# Patient Record
Sex: Male | Born: 1965 | Race: White | Hispanic: No | Marital: Married | Smoking: Former smoker
Health system: Southern US, Community
[De-identification: ages and names within clinical notes are randomized; demographics above are authoritative.]

## PROBLEM LIST (undated history)

## (undated) DIAGNOSIS — R7303 Prediabetes: Secondary | ICD-10-CM

## (undated) DIAGNOSIS — F1010 Alcohol abuse, uncomplicated: Secondary | ICD-10-CM

## (undated) DIAGNOSIS — Z955 Presence of coronary angioplasty implant and graft: Secondary | ICD-10-CM

## (undated) DIAGNOSIS — I2510 Atherosclerotic heart disease of native coronary artery without angina pectoris: Secondary | ICD-10-CM

## (undated) DIAGNOSIS — Z720 Tobacco use: Secondary | ICD-10-CM

## (undated) DIAGNOSIS — I10 Essential (primary) hypertension: Secondary | ICD-10-CM

## (undated) DIAGNOSIS — I219 Acute myocardial infarction, unspecified: Secondary | ICD-10-CM

## (undated) DIAGNOSIS — M1990 Unspecified osteoarthritis, unspecified site: Secondary | ICD-10-CM

## (undated) HISTORY — PX: ARTHROSCOPIC REPAIR ACL: SUR80

## (undated) HISTORY — DX: Alcohol abuse, uncomplicated: F10.10

## (undated) HISTORY — PX: KNEE SURGERY: SHX244

## (undated) HISTORY — PX: ANTERIOR CRUCIATE LIGAMENT REPAIR: SHX115

---

## 1996-06-04 HISTORY — PX: APPENDECTOMY: SHX54

## 1999-07-08 ENCOUNTER — Emergency Department (HOSPITAL_COMMUNITY): Admission: EM | Admit: 1999-07-08 | Discharge: 1999-07-08 | Payer: Self-pay | Admitting: Emergency Medicine

## 2011-06-29 ENCOUNTER — Ambulatory Visit (INDEPENDENT_AMBULATORY_CARE_PROVIDER_SITE_OTHER): Payer: BC Managed Care – PPO | Admitting: Internal Medicine

## 2011-06-29 ENCOUNTER — Encounter: Payer: Self-pay | Admitting: Internal Medicine

## 2011-06-29 DIAGNOSIS — R05 Cough: Secondary | ICD-10-CM

## 2011-06-29 DIAGNOSIS — F17200 Nicotine dependence, unspecified, uncomplicated: Secondary | ICD-10-CM

## 2011-06-29 DIAGNOSIS — R059 Cough, unspecified: Secondary | ICD-10-CM

## 2011-06-29 MED ORDER — LEVOFLOXACIN 500 MG PO TABS: 500.0000 mg | ORAL_TABLET | Freq: Every day | ORAL | Status: DC

## 2011-06-30 ENCOUNTER — Encounter: Payer: Self-pay | Admitting: Internal Medicine

## 2011-06-30 DIAGNOSIS — R05 Cough: Secondary | ICD-10-CM | POA: Insufficient documentation

## 2011-06-30 DIAGNOSIS — F17200 Nicotine dependence, unspecified, uncomplicated: Secondary | ICD-10-CM | POA: Insufficient documentation

## 2011-06-30 DIAGNOSIS — R059 Cough, unspecified: Secondary | ICD-10-CM | POA: Insufficient documentation

## 2011-06-30 NOTE — Assessment & Plan Note (Signed)
Discussed and recommended cessation.

## 2011-06-30 NOTE — Assessment & Plan Note (Signed)
Suspect infectious etiology. Begin levaquin x 7days. Pt defers cxr currently. Followup if no improvement or worsening.

## 2011-06-30 NOTE — Progress Notes (Signed)
  Subjective:    Patient ID: William Bates, male    DOB: Mar 04, 1966, 46 y.o.   MRN: [id number]  HPI Pt presents to clinic for evaluation of cough. Notes one month h/o cough productive for yellow sputum without hemoptysis, fever or chills. Notes some chest discomfort with cough but no dyspnea or wheezing. Taking otc medication without improvement. No alleviating or exacerbating factors. No other complaints.   Past Medical History  Diagnosis Date  . Alcohol abuse    Past Surgical History  Procedure Date  . Appendectomy 1998  . Anterior cruciate ligament repair [phone]    right    reports that he has been smoking.  He does not have any smokeless tobacco history on file. He reports that he does not drink alcohol or use illicit drugs. family history includes Arthritis in his father; Diabetes in his father; Hypertension in his mother; and Prostate cancer in his paternal uncle.  There is no history of Breast cancer, and Heart disease, and Colon cancer, . No Known Allergies   Review of Systems  HENT: Positive for congestion.   Respiratory: Positive for cough. Negative for shortness of breath and wheezing.   All other systems reviewed and are negative.       Objective:   Physical Exam  Nursing note and vitals reviewed. Constitutional: He appears well-developed and well-nourished. No distress.  HENT:  Head: Normocephalic and atraumatic.  Right Ear: External ear normal.  Left Ear: External ear normal.  Nose: Nose normal.  Mouth/Throat: Oropharynx is clear and moist. No oropharyngeal exudate.  Eyes: Conjunctivae are normal. Right eye exhibits no discharge. Left eye exhibits no discharge. No scleral icterus.  Neck: Neck supple.  Cardiovascular: Normal rate, regular rhythm and normal heart sounds.  Exam reveals no gallop and no friction rub.   No murmur heard. Pulmonary/Chest: Effort normal and breath sounds normal. No respiratory distress. He has no wheezes. He has no rales.    Lymphadenopathy:    He has no cervical adenopathy.  Neurological: He is alert.  Skin: Skin is warm and dry. He is not diaphoretic.  Psychiatric: He has a normal mood and affect.          Assessment & Plan:

## 2012-03-10 ENCOUNTER — Ambulatory Visit (INDEPENDENT_AMBULATORY_CARE_PROVIDER_SITE_OTHER): Payer: BC Managed Care – PPO | Admitting: Internal Medicine

## 2012-03-10 ENCOUNTER — Encounter: Payer: Self-pay | Admitting: Internal Medicine

## 2012-03-10 VITALS — BP 118/76 | HR 68 | Temp 98.1°F | Wt 230.0 lb

## 2012-03-10 DIAGNOSIS — J069 Acute upper respiratory infection, unspecified: Secondary | ICD-10-CM

## 2012-03-10 MED ORDER — LEVOFLOXACIN 500 MG PO TABS: 500.0000 mg | ORAL_TABLET | Freq: Every day | ORAL | Status: AC

## 2012-03-10 NOTE — Progress Notes (Signed)
  Subjective:    Patient ID: William Bates, male    DOB: [birth year], 46 y.o.   MRN: [id number]  HPI patient presents to clinic for evaluation of cough and congestion. Notes six-day history of sore throat, cough intermittently productive for yellow-green sputum without hemoptysis as well as nasal congestion. Denies fever chills, shortness of breath or wheezing. Taking over-the-counter cold medication without improvement. No other alleviating or exacerbating factors.  Past Medical History  Diagnosis Date  . Alcohol abuse    Past Surgical History  Procedure Date  . Appendectomy 1998  . Anterior cruciate ligament repair [phone]    right    reports that he has been smoking.  He does not have any smokeless tobacco history on file. He reports that he does not drink alcohol or use illicit drugs. family history includes Arthritis in his father; Diabetes in his father; Hypertension in his mother; and Prostate cancer in his paternal uncle.  There is no history of Breast cancer, and Heart disease, and Colon cancer, . No Known Allergies  Review of Systems see history of present illness     Objective:   Physical Exam  Nursing note and vitals reviewed. Constitutional: He appears well-developed and well-nourished. No distress.  HENT:  Head: Normocephalic and atraumatic.  Right Ear: Tympanic membrane, external ear and ear canal normal.  Left Ear: Tympanic membrane, external ear and ear canal normal.  Nose: Nose normal.  Mouth/Throat: Uvula is midline, oropharynx is clear and moist and mucous membranes are normal. No oropharyngeal exudate, posterior oropharyngeal edema, posterior oropharyngeal erythema or tonsillar abscesses.  Pulmonary/Chest: Effort normal and breath sounds normal. No respiratory distress. He has no wheezes. He has no rales.  Neurological: He is alert.  Skin: He is not diaphoretic.  Psychiatric: He has a normal mood and affect.          Assessment & Plan:

## 2012-03-10 NOTE — Assessment & Plan Note (Signed)
Continue symptomatic treatment. Given printed anabolic prescription to hold. Begin antibiotic if symptoms do not improve after total duration of approximately 8-10 days. Followup if no improvement or worsening.

## 2012-11-17 ENCOUNTER — Ambulatory Visit (INDEPENDENT_AMBULATORY_CARE_PROVIDER_SITE_OTHER): Payer: BC Managed Care – PPO | Admitting: Family

## 2012-11-17 ENCOUNTER — Encounter: Payer: Self-pay | Admitting: Family

## 2012-11-17 VITALS — BP 122/80 | HR 70 | Temp 98.3°F | Wt 240.0 lb

## 2012-11-17 DIAGNOSIS — J329 Chronic sinusitis, unspecified: Secondary | ICD-10-CM

## 2012-11-17 DIAGNOSIS — F17200 Nicotine dependence, unspecified, uncomplicated: Secondary | ICD-10-CM

## 2012-11-17 MED ORDER — AMOXICILLIN-POT CLAVULANATE 875-125 MG PO TABS: 1.0000 | ORAL_TABLET | Freq: Two times a day (BID) | ORAL | Status: DC

## 2012-11-17 NOTE — Assessment & Plan Note (Signed)
Recommended nasal saline rinses, augmentin, call if symptoms worse or if not improved in 2-3 days.

## 2012-11-17 NOTE — Patient Instructions (Addendum)

## 2012-11-17 NOTE — Progress Notes (Signed)
  Subjective:    Patient ID: William Bates, male    DOB: 11-Feb-1966, 47 y.o.   MRN: [id number]  HPI  William Bates is a 47 yr old male who presents today with chief complaint of sinus congestion.  Reports + frontal sinus congestion. Congestion has been present x 8 days. Started with sore throat and congestion. Reports mild cough.  Nasal drainage is green and is more copious from the left nare.   Yesterday woke up and felt "like I got hit by a truck." Denies known fever.  Reports associated diarrhea and bloating. Denies abdominal pain.    Tobacco abuse- < 1/2 PPD.    Review of Systems See HPI  Past Medical History  Diagnosis Date  . Alcohol abuse     History   Social History  . Marital Status: Single    Spouse Name: N/A    Number of Children: N/A  . Years of Education: N/A   Occupational History  . Not on file.   Social History Main Topics  . Smoking status: Current Every Day Smoker  . Smokeless tobacco: Not on file     Comment: 1/2 ppd  . Alcohol Use: No  . Drug Use: No  . Sexually Active: Not on file   Other Topics Concern  . Not on file   Social History Narrative  . No narrative on file    Past Surgical History  Procedure Laterality Date  . Appendectomy  1998  . Anterior cruciate ligament repair  [phone]    right    Family History  Problem Relation Age of Onset  . Arthritis Father   . Hypertension Mother   . Diabetes Father   . Prostate cancer Paternal Uncle   . Breast cancer Neg Hx   . Heart disease Neg Hx   . Colon cancer Neg Hx     No Known Allergies  No current outpatient prescriptions on file prior to visit.   No current facility-administered medications on file prior to visit.    BP 122/80  Pulse 70  Temp(Src) 98.3 F (36.8 C) (Oral)  Wt 240 lb (108.863 kg)  BMI 33.23 kg/m2  SpO2 98%       Objective:   Physical Exam  Constitutional: He is oriented to person, place, and time. He appears well-developed and well-nourished. No  distress.  HENT:  Head: Normocephalic and atraumatic.  Right Ear: Tympanic membrane and ear canal normal.  Left Ear: Tympanic membrane and ear canal normal.  No significant frontal or maxillary sinus tenderness to palpation.  Mild oropharyngeal erythema without exudates.  Eyes: No scleral icterus.  Cardiovascular: Normal rate and regular rhythm.   No murmur heard. Pulmonary/Chest: Effort normal and breath sounds normal. No respiratory distress. He has no wheezes. He has no rales. He exhibits no tenderness.  Lymphadenopathy:    He has no cervical adenopathy.  Neurological: He is alert and oriented to person, place, and time.  Psychiatric: He has a normal mood and affect. His behavior is normal. Judgment and thought content normal.          Assessment & Plan:

## 2012-11-17 NOTE — Assessment & Plan Note (Signed)
Pt counseled on smoking cessation.  

## 2015-10-18 ENCOUNTER — Ambulatory Visit (INDEPENDENT_AMBULATORY_CARE_PROVIDER_SITE_OTHER): Payer: Managed Care, Other (non HMO)

## 2015-10-18 ENCOUNTER — Ambulatory Visit
Admission: EM | Admit: 2015-10-18 | Discharge: 2015-10-18 | Disposition: A | Discharge status: Home / Self Care | Payer: Managed Care, Other (non HMO) | Attending: Family Medicine | Admitting: Family Medicine

## 2015-10-18 ENCOUNTER — Encounter: Payer: Self-pay | Admitting: *Deleted

## 2015-10-18 DIAGNOSIS — S39012A Strain of muscle, fascia and tendon of lower back, initial encounter: Secondary | ICD-10-CM

## 2015-10-18 MED ORDER — METAXALONE 800 MG PO TABS: 800.0000 mg | ORAL_TABLET | Freq: Three times a day (TID) | ORAL | Status: DC

## 2015-10-18 MED ORDER — DIAZEPAM 2 MG PO TABS: 2.0000 mg | ORAL_TABLET | Freq: Three times a day (TID) | ORAL | Status: DC

## 2015-10-18 MED ORDER — KETOROLAC TROMETHAMINE 60 MG/2ML IM SOLN
60.0000 mg | Freq: Once | INTRAMUSCULAR | Status: AC
  Administered 2015-10-18: 60 mg via INTRAMUSCULAR

## 2015-10-18 MED ORDER — MELOXICAM 15 MG PO TABS
15.0000 mg | ORAL_TABLET | Freq: Every day | ORAL | Status: DC
Start: 2015-10-18 — End: 2016-11-04

## 2015-10-18 NOTE — Discharge Instructions (Signed)
Back Injury Prevention °Back injuries can be very painful. They can also be difficult to heal. After having one back injury, you are more likely to injure your back again. It is important to learn how to avoid injuring or re-injuring your back. The following tips can help you to prevent a back injury. °WHAT SHOULD I KNOW ABOUT PHYSICAL FITNESS? °· Exercise for 30 minutes per day on most days of the week or as directed by your health care provider. Make sure to: °· Do aerobic exercises, such as walking, jogging, biking, or swimming. °· Do exercises that increase balance and strength, such as tai chi and yoga. These can decrease your risk of falling and injuring your back. °· Do stretching exercises to help with flexibility. °· Try to develop strong abdominal muscles. Your abdominal muscles provide a lot of the support that is needed by your back. °· Maintain a healthy weight.  This helps to decrease your risk of a back injury. °WHAT SHOULD I KNOW ABOUT MY DIET? °· Talk with your health care provider about your overall diet. Take supplements and vitamins only as directed by your health care provider. °· Talk with your health care provider about how much calcium and vitamin D you need each day. These nutrients help to prevent weakening of the bones (osteoporosis). Osteoporosis can cause broken (fractured) bones, which lead to back pain. °· Include good sources of calcium in your diet, such as dairy products, green leafy vegetables, and products that have had calcium added to them (fortified). °· Include good sources of vitamin D in your diet, such as milk and foods that are fortified with vitamin D. °WHAT SHOULD I KNOW ABOUT MY POSTURE? °· Sit up straight and stand up straight. Avoid leaning forward when you sit or hunching over when you stand. °· Choose chairs that have good low-back (lumbar) support. °· If you work at a desk, sit close to it so you do not need to lean over. Keep your chin tucked in. Keep your neck  drawn back, and keep your elbows bent at a right angle. Your arms should look like the letter "L." °· Sit high and close to the steering wheel when you drive. Add a lumbar support to your car seat, if needed. °· Avoid sitting or standing in one position for very long. Take breaks to get up, stretch, and walk around at least one time every hour. Take breaks every hour if you are driving for long periods of time. °· Sleep on your side with your knees slightly bent, or sleep on your back with a pillow under your knees. Do not lie on the front of your body to sleep. °WHAT SHOULD I KNOW ABOUT LIFTING, TWISTING, AND REACHING? °Lifting and Heavy Lifting °· Avoid heavy lifting, especially repetitive heavy lifting. If you must do heavy lifting: °· Stretch before lifting. °· Work slowly. °· Rest between lifts. °· Use a tool such as a cart or a dolly to move objects if one is available. °· Make several small trips instead of carrying one heavy load. °· Ask for help when you need it, especially when moving big objects. °· Follow these steps when lifting: °· Stand with your feet shoulder-width apart. °· Get as close to the object as you can. Do not try to pick up a heavy object that is far from your body. °· Use handles or lifting straps if they are available. °· Bend at your knees. Squat down, but keep your heels off the floor. °·   Keep your shoulders pulled back, your chin tucked in, and your back straight. °· Lift the object slowly while you tighten the muscles in your legs, abdomen, and buttocks. Keep the object as close to the center of your body as possible. °· Follow these steps when putting down a heavy load: °· Stand with your feet shoulder-width apart. °· Lower the object slowly while you tighten the muscles in your legs, abdomen, and buttocks. Keep the object as close to the center of your body as possible. °· Keep your shoulders pulled back, your chin tucked in, and your back straight. °· Bend at your knees. Squat  down, but keep your heels off the floor. °· Use handles or lifting straps if they are available. °Twisting and Reaching °· Avoid lifting heavy objects above your waist. °· Do not twist at your waist while you are lifting or carrying a load. If you need to turn, move your feet. °· Do not bend over without bending at your knees. °· Avoid reaching over your head, across a table, or for an object on a high surface. °WHAT ARE SOME OTHER TIPS? °· Avoid wet floors and icy ground. Keep sidewalks clear of ice to prevent falls. °· Do not sleep on a mattress that is too soft or too hard. °· Keep items that are used frequently within easy reach. °· Put heavier objects on shelves at waist level, and put lighter objects on lower or higher shelves. °· Find ways to decrease your stress, such as exercise, massage, or relaxation techniques. Stress can build up in your muscles. Tense muscles are more vulnerable to injury. °· Talk with your health care provider if you feel anxious or depressed. These conditions can make back pain worse. °· Wear flat heel shoes with cushioned soles. °· Avoid sudden movements. °· Use both shoulder straps when carrying a backpack. °· Do not use any tobacco products, including cigarettes, chewing tobacco, or electronic cigarettes. If you need help quitting, ask your health care provider. °  °This information is not intended to replace advice given to you by your health care provider. Make sure you discuss any questions you have with your health care provider. °  °Document Released: 06/28/2004 Document Revised: 10/05/2014 Document Reviewed: 05/25/2014 °Elsevier Interactive Patient Education ©2016 [employer] ° °Lumbosacral Strain °Lumbosacral strain is a strain of any of the parts that make up your lumbosacral vertebrae. Your lumbosacral vertebrae are the bones that make up the lower third of your backbone. Your lumbosacral vertebrae are held together by muscles and tough, fibrous tissue (ligaments).    °CAUSES  °A sudden blow to your back can cause lumbosacral strain. Also, anything that causes an excessive stretch of the muscles in the low back can cause this strain. This is typically seen when people exert themselves strenuously, fall, lift heavy objects, bend, or crouch repeatedly. °RISK FACTORS °· Physically demanding work. °· Participation in pushing or pulling sports or sports that require a sudden twist of the back (tennis, golf, baseball). °· Weight lifting. °· Excessive lower back curvature. °· Forward-tilted pelvis. °· Weak back or abdominal muscles or both. °· Tight hamstrings. °SIGNS AND SYMPTOMS  °Lumbosacral strain may cause pain in the area of your injury or pain that moves (radiates) down your leg.  °DIAGNOSIS °Your health care provider can often diagnose lumbosacral strain through a physical exam. In some cases, you may need tests such as X-ray exams.  °TREATMENT  °Treatment for your lower back injury depends on many factors that   your clinician will have to evaluate. However, most treatment will include the use of anti-inflammatory medicines. °HOME CARE INSTRUCTIONS  °· Avoid hard physical activities (tennis, racquetball, waterskiing) if you are not in proper physical condition for it. This may aggravate or create problems. °· If you have a back problem, avoid sports requiring sudden body movements. Swimming and walking are generally safer activities. °· Maintain good posture. °· Maintain a healthy weight. °· For acute conditions, you may put ice on the injured area. °¨ Put ice in a plastic bag. °¨ Place a towel between your skin and the bag. °¨ Leave the ice on for 20 minutes, 2-3 times a day. °· When the low back starts healing, stretching and strengthening exercises may be recommended. °SEEK MEDICAL CARE IF: °· Your back pain is getting worse. °· You experience severe back pain not relieved with medicines. °SEEK IMMEDIATE MEDICAL CARE IF:  °· You have numbness, tingling, weakness, or problems  with the use of your arms or legs. °· There is a change in bowel or bladder control. °· You have increasing pain in any area of the body, including your belly (abdomen). °· You notice shortness of breath, dizziness, or feel faint. °· You feel sick to your stomach (nauseous), are throwing up (vomiting), or become sweaty. °· You notice discoloration of your toes or legs, or your feet get very cold. °MAKE SURE YOU:  °· Understand these instructions. °· Will watch your condition. °· Will get help right away if you are not doing well or get worse. °  °This information is not intended to replace advice given to you by your health care provider. Make sure you discuss any questions you have with your health care provider. °  °Document Released: 02/28/2005 Document Revised: 06/11/2014 Document Reviewed: 01/07/2013 °Elsevier Interactive Patient Education ©2016 [employer] ° °

## 2015-10-18 NOTE — ED Notes (Signed)
Onset of back pain approx 1 month ago after working in his yard. Does not remember a specific point of injury. Denies paresthesias.

## 2015-10-18 NOTE — ED Provider Notes (Signed)
CSN: [id number]     Arrival date & time 10/18/15  1451 History   First MD Initiated Contact with Patient 10/18/15 1717     Chief Complaint  Patient presents with  . Back Pain   (Consider location/radiation/quality/duration/timing/severity/associated sxs/prior Treatment) HPI  50 year old male who presents with a one-month history of nonradiating low back pain. He states that while working in his yard he was pulling weeds one day and noticed mild low back pain today he was using his drinking tremor and noticed the worsening of the pain which has persisted since that time. Misty StanleyLisa feels it mostly in both of his hips but indicates posteriorly around the iliac area. No radiation. He has no incontinence. He said back aches in the past but never has lasted this long. He states that motion is the worst activity.      Past Medical History  Diagnosis Date  . Alcohol abuse    Past Surgical History  Procedure Laterality Date  . Appendectomy  1998  . Anterior cruciate ligament repair  [phone]    right   Family History  Problem Relation Age of Onset  . Arthritis Father   . Hypertension Mother   . Diabetes Father   . Prostate cancer Paternal Uncle   . Breast cancer Neg Hx   . Heart disease Neg Hx   . Colon cancer Neg Hx    Social History  Substance Use Topics  . Smoking status: Current Every Day Smoker  . Smokeless tobacco: None     Comment: 1/2 ppd  . Alcohol Use: No    Review of Systems  Constitutional: Positive for activity change. Negative for fever, chills and fatigue.  Musculoskeletal: Positive for back pain.  All other systems reviewed and are negative.   Allergies  Review of patient's allergies indicates no known allergies.  Home Medications   Prior to Admission medications   Medication Sig Start Date End Date Taking? Authorizing Provider  amoxicillin-clavulanate (AUGMENTIN) 875-125 MG per tablet Take 1 tablet by mouth 2 (two) times daily. 11/17/12   Sandford CrazeMelissa O'Sullivan,  NP  diazepam (VALIUM) 2 MG tablet Take 1 tablet (2 mg total) by mouth 3 (three) times daily. 10/18/15   Lutricia FeilWilliam P Westlee Devita, PA-C  meloxicam (MOBIC) 15 MG tablet Take 1 tablet (15 mg total) by mouth daily. 10/18/15   Lutricia FeilWilliam P Kahliyah Dick, PA-C  metaxalone (SKELAXIN) 800 MG tablet Take 1 tablet (800 mg total) by mouth 3 (three) times daily. Start after finishing Valium for muscle spasm 10/18/15   Lutricia FeilWilliam P Abelardo Seidner, PA-C   Meds Ordered and Administered this Visit   Medications  ketorolac (TORADOL) injection 60 mg (60 mg Intramuscular Given 10/18/15 1746)    BP 128/81 mmHg  Pulse 53  Temp(Src) 97.5 F (36.4 C) (Oral)  Resp 16  Ht 6\' 1"  (1.854 m)  Wt 250 lb (113.399 kg)  BMI 32.99 kg/m2  SpO2 98% No data found.   Physical Exam  Constitutional: He is oriented to person, place, and time. He appears well-developed and well-nourished. No distress.  HENT:  Head: Normocephalic and atraumatic.  Eyes: Conjunctivae are normal. Pupils are equal, round, and reactive to light.  Neck: Normal range of motion. Neck supple.  Musculoskeletal: He exhibits tenderness.  Eximination of the lumbar spine shows a level pelvis in stance. He does have flattening of the lordotic curve. He was able to forward flexed to the level of his ankles with his hands. Lateral flexion was more uncomfortable to the right. Thoracic rotation to  the right was also the most uncomfortable. He was able to toe and heel walk adequately. Sensation was intact distally. Leg raise testing was negative at 90 bilaterally in the sitting position. EHL anterior tibialis and peroneal are strong to clinical testing. DTRs are absent on the right at the knee but to 2+ over 4 at the knee on the left and both ankles. There is no clonus. He did have extensive surgery re-scarring of the right knee.  Neurological: He is alert and oriented to person, place, and time. He displays abnormal reflex. He exhibits normal muscle tone. Coordination normal.  Skin: Skin is  warm and dry. He is not diaphoretic.  Psychiatric: He has a normal mood and affect. His behavior is normal. Judgment and thought content normal.  Nursing note and vitals reviewed.   ED Course  Procedures (including critical care time)  Labs Review Labs Reviewed - No data to display  Imaging Review Dg Lumbar Spine Complete  10/18/2015  CLINICAL DATA:  50 year old male with lower back pain. EXAM: LUMBAR SPINE - COMPLETE 4+ VIEW COMPARISON:  None. FINDINGS: There is no acute fracture or subluxation of the lumbar spine. There is minimal compression deformity of the superior endplates of the L3 and L4 vertebrae, likely old. Clinical correlation is recommended. The bones are mildly osteopenic. The visualized transverse and spinous processes appear intact. The soft tissues are grossly unremarkable. IMPRESSION: No acute lumbar spine osseous pathology. Electronically Signed   By: Elgie Collard M.D.   On: 10/18/2015 18:11     Visual Acuity Review  Right Eye Distance:   Left Eye Distance:   Bilateral Distance:    Right Eye Near:   Left Eye Near:    Bilateral Near:     Medications  ketorolac (TORADOL) injection 60 mg (60 mg Intramuscular Given 10/18/15 1746)     MDM   1. Lumbar strain, initial encounter    New Prescriptions   DIAZEPAM (VALIUM) 2 MG TABLET    Take 1 tablet (2 mg total) by mouth 3 (three) times daily.   MELOXICAM (MOBIC) 15 MG TABLET    Take 1 tablet (15 mg total) by mouth daily.   METAXALONE (SKELAXIN) 800 MG TABLET    Take 1 tablet (800 mg total) by mouth 3 (three) times daily. Start after finishing Valium for muscle spasm  Plan: 1. Test/x-ray results and diagnosis reviewed with patient 2. rx as per orders; risks, benefits, potential side effects reviewed with patient 3. Recommend supportive treatment with Rest and heat as necessary. I have told him that sitting lifting and bending of the 3 worsening sick and do per his back while she irritated. I placed him on some  Valium for muscle spasm for 2 days and then switch him to Skelaxin thereafter. He did not receive much relief of his pain with the Toradol injection. To help with compliance I will place him on Mobic 50 mg daily and encouraged to take with food. Is not improving I recommended that he follow-up with chiropractor as primary care physician for consideration of physical therapy program. 4. F/u prn if symptoms worsen or don't improve      Lutricia Feil, PA-C 10/18/15 1850

## 2016-10-02 DIAGNOSIS — Z955 Presence of coronary angioplasty implant and graft: Secondary | ICD-10-CM

## 2016-10-02 HISTORY — DX: Presence of coronary angioplasty implant and graft: Z95.5

## 2016-10-30 ENCOUNTER — Inpatient Hospital Stay
Admit: 2016-10-30 | Discharge: 2016-10-30 | Disposition: A | Discharge status: Home / Self Care | Payer: 59 | Attending: Cardiology | Admitting: Cardiology

## 2016-10-30 ENCOUNTER — Emergency Department: Payer: 59

## 2016-10-30 ENCOUNTER — Encounter: Payer: Self-pay | Admitting: *Deleted

## 2016-10-30 ENCOUNTER — Inpatient Hospital Stay
Admission: EM | Admit: 2016-10-30 | Discharge: 2016-11-04 | DRG: 246 | Disposition: A | Discharge status: Home / Self Care | Payer: 59 | Attending: Internal Medicine | Admitting: Internal Medicine

## 2016-10-30 ENCOUNTER — Encounter
Admission: EM | Disposition: A | Discharge status: Home / Self Care | Payer: Self-pay | Source: Home / Self Care | Attending: Internal Medicine

## 2016-10-30 DIAGNOSIS — J690 Pneumonitis due to inhalation of food and vomit: Secondary | ICD-10-CM | POA: Diagnosis not present

## 2016-10-30 DIAGNOSIS — I2102 ST elevation (STEMI) myocardial infarction involving left anterior descending coronary artery: Secondary | ICD-10-CM | POA: Diagnosis not present

## 2016-10-30 DIAGNOSIS — J9690 Respiratory failure, unspecified, unspecified whether with hypoxia or hypercapnia: Secondary | ICD-10-CM | POA: Diagnosis not present

## 2016-10-30 DIAGNOSIS — I10 Essential (primary) hypertension: Secondary | ICD-10-CM | POA: Diagnosis present

## 2016-10-30 DIAGNOSIS — I462 Cardiac arrest due to underlying cardiac condition: Secondary | ICD-10-CM | POA: Diagnosis present

## 2016-10-30 DIAGNOSIS — I469 Cardiac arrest, cause unspecified: Secondary | ICD-10-CM

## 2016-10-30 DIAGNOSIS — I472 Ventricular tachycardia: Secondary | ICD-10-CM | POA: Diagnosis present

## 2016-10-30 DIAGNOSIS — E785 Hyperlipidemia, unspecified: Secondary | ICD-10-CM | POA: Diagnosis present

## 2016-10-30 DIAGNOSIS — I255 Ischemic cardiomyopathy: Secondary | ICD-10-CM | POA: Diagnosis present

## 2016-10-30 DIAGNOSIS — I4901 Ventricular fibrillation: Secondary | ICD-10-CM | POA: Diagnosis present

## 2016-10-30 DIAGNOSIS — I252 Old myocardial infarction: Secondary | ICD-10-CM | POA: Diagnosis not present

## 2016-10-30 DIAGNOSIS — I213 ST elevation (STEMI) myocardial infarction of unspecified site: Secondary | ICD-10-CM | POA: Diagnosis present

## 2016-10-30 DIAGNOSIS — R079 Chest pain, unspecified: Secondary | ICD-10-CM

## 2016-10-30 DIAGNOSIS — Z79899 Other long term (current) drug therapy: Secondary | ICD-10-CM

## 2016-10-30 DIAGNOSIS — Z4659 Encounter for fitting and adjustment of other gastrointestinal appliance and device: Secondary | ICD-10-CM

## 2016-10-30 DIAGNOSIS — Z8249 Family history of ischemic heart disease and other diseases of the circulatory system: Secondary | ICD-10-CM | POA: Diagnosis not present

## 2016-10-30 DIAGNOSIS — F17210 Nicotine dependence, cigarettes, uncomplicated: Secondary | ICD-10-CM | POA: Diagnosis present

## 2016-10-30 DIAGNOSIS — R7303 Prediabetes: Secondary | ICD-10-CM | POA: Diagnosis present

## 2016-10-30 DIAGNOSIS — R0600 Dyspnea, unspecified: Secondary | ICD-10-CM

## 2016-10-30 DIAGNOSIS — Z7982 Long term (current) use of aspirin: Secondary | ICD-10-CM

## 2016-10-30 HISTORY — PX: CORONARY STENT INTERVENTION: CATH118234

## 2016-10-30 HISTORY — PX: LEFT HEART CATH AND CORONARY ANGIOGRAPHY: CATH118249

## 2016-10-30 HISTORY — DX: Tobacco use: Z72.0

## 2016-10-30 HISTORY — DX: Prediabetes: R73.03

## 2016-10-30 LAB — CBC WITH DIFFERENTIAL/PLATELET
Basophils Absolute: 0.1 10*3/uL (ref 0–0.1)
Basophils Relative: 1 %
Eosinophils Absolute: 0.5 10*3/uL (ref 0–0.7)
Eosinophils Relative: 4 %
HEMATOCRIT: 45.8 % (ref 40.0–52.0)
Hemoglobin: 15.5 g/dL (ref 13.0–18.0)
LYMPHS PCT: 39 %
Lymphs Abs: 4.9 10*3/uL — ABNORMAL HIGH (ref 1.0–3.6)
MCH: 29 pg (ref 26.0–34.0)
MCHC: 33.9 g/dL (ref 32.0–36.0)
MCV: 85.4 fL (ref 80.0–100.0)
MONO ABS: 0.8 10*3/uL (ref 0.2–1.0)
Monocytes Relative: 7 %
NEUTROS ABS: 6.2 10*3/uL (ref 1.4–6.5)
Neutrophils Relative %: 49 %
Platelets: 208 10*3/uL (ref 150–440)
RBC: 5.36 MIL/uL (ref 4.40–5.90)
RDW: 13.6 % (ref 11.5–14.5)
WBC: 12.6 10*3/uL — ABNORMAL HIGH (ref 3.8–10.6)

## 2016-10-30 LAB — COMPREHENSIVE METABOLIC PANEL
ALT: 31 U/L (ref 17–63)
ANION GAP: 6 (ref 5–15)
AST: 23 U/L (ref 15–41)
Albumin: 4.7 g/dL (ref 3.5–5.0)
Alkaline Phosphatase: 138 U/L — ABNORMAL HIGH (ref 38–126)
BILIRUBIN TOTAL: 0.5 mg/dL (ref 0.3–1.2)
BUN: 20 mg/dL (ref 6–20)
CO2: 26 mmol/L (ref 22–32)
Calcium: 9.1 mg/dL (ref 8.9–10.3)
Chloride: 106 mmol/L (ref 101–111)
Creatinine, Ser: 1.48 mg/dL — ABNORMAL HIGH (ref 0.61–1.24)
GFR calc Af Amer: >60 mL/min (ref >60)
GFR, EST NON AFRICAN AMERICAN: 53 mL/min — AB (ref >60)
Glucose, Bld: 158 mg/dL — ABNORMAL HIGH (ref 65–99)
POTASSIUM: 4.1 mmol/L (ref 3.5–5.1)
Sodium: 138 mmol/L (ref 135–145)
TOTAL PROTEIN: 7.2 g/dL (ref 6.5–8.1)

## 2016-10-30 LAB — LIPID PANEL
CHOLESTEROL: 202 mg/dL — AB (ref 0–200)
HDL: 37 mg/dL — AB (ref >40)
LDL Cholesterol: 110 mg/dL — ABNORMAL HIGH (ref 0–99)
TRIGLYCERIDES: 276 mg/dL — AB (ref <150)
Total CHOL/HDL Ratio: 5.5 RATIO
VLDL: 55 mg/dL — ABNORMAL HIGH (ref 0–40)

## 2016-10-30 LAB — TROPONIN I: TROPONIN I: 0.08 ng/mL — AB (ref <0.03)

## 2016-10-30 LAB — PROTIME-INR
INR: 1.1
Prothrombin Time: 14.2 seconds (ref 11.4–15.2)

## 2016-10-30 LAB — APTT: APTT: 38 s — AB (ref 24–36)

## 2016-10-30 LAB — POCT ACTIVATED CLOTTING TIME: ACTIVATED CLOTTING TIME: 378 s

## 2016-10-30 SURGERY — LEFT HEART CATH AND CORONARY ANGIOGRAPHY
Anesthesia: Moderate Sedation

## 2016-10-30 MED ORDER — SODIUM CHLORIDE 0.9 % WEIGHT BASED INFUSION
1.0000 mL/kg/h | INTRAVENOUS | Status: DC
  Administered 2016-10-30: 1 mL/kg/h via INTRAVENOUS

## 2016-10-30 MED ORDER — MORPHINE SULFATE (PF) 2 MG/ML IV SOLN
1.0000 mg | Freq: Once | INTRAVENOUS | Status: AC
  Administered 2016-10-30: 1 mg via INTRAVENOUS
  Filled 2016-10-30: qty 1

## 2016-10-30 MED ORDER — BIVALIRUDIN BOLUS VIA INFUSION - CUPID
INTRAVENOUS | Status: DC | PRN
  Administered 2016-10-30: 85.05 mg via INTRAVENOUS

## 2016-10-30 MED ORDER — ASPIRIN 81 MG PO CHEW: 324.0000 mg | CHEWABLE_TABLET | Freq: Once | ORAL | Status: DC

## 2016-10-30 MED ORDER — MIDAZOLAM HCL 2 MG/2ML IJ SOLN
INTRAMUSCULAR | Status: DC | PRN
  Administered 2016-10-30: 1 mg via INTRAVENOUS
  Administered 2016-10-30: 0.5 mg via INTRAVENOUS

## 2016-10-30 MED ORDER — BIVALIRUDIN TRIFLUOROACETATE 250 MG IV SOLR
INTRAVENOUS | Status: AC
  Filled 2016-10-30: qty 250

## 2016-10-30 MED ORDER — SODIUM CHLORIDE 0.9% FLUSH
3.0000 mL | Freq: Two times a day (BID) | INTRAVENOUS | Status: DC
  Administered 2016-10-31 – 2016-11-03 (×6): 3 mL via INTRAVENOUS

## 2016-10-30 MED ORDER — IOPAMIDOL (ISOVUE-300) INJECTION 61%
INTRAVENOUS | Status: DC | PRN
  Administered 2016-10-30: 245 mL via INTRA_ARTERIAL

## 2016-10-30 MED ORDER — NITROGLYCERIN 1 MG/10 ML FOR IR/CATH LAB
INTRA_ARTERIAL | Status: DC | PRN
  Administered 2016-10-30: 300 mL via INTRACORONARY
  Administered 2016-10-30: 200 ug via INTRACORONARY

## 2016-10-30 MED ORDER — FENTANYL CITRATE (PF) 100 MCG/2ML IJ SOLN
INTRAMUSCULAR | Status: AC
  Filled 2016-10-30: qty 2

## 2016-10-30 MED ORDER — TIROFIBAN (AGGRASTAT) BOLUS VIA INFUSION
INTRAVENOUS | Status: DC | PRN
  Administered 2016-10-30: 2835 ug via INTRAVENOUS

## 2016-10-30 MED ORDER — ATORVASTATIN CALCIUM 20 MG PO TABS
40.0000 mg | ORAL_TABLET | Freq: Every day | ORAL | Status: DC
  Administered 2016-10-31 – 2016-11-03 (×4): 40 mg via ORAL
  Filled 2016-10-30 (×3): qty 2
  Filled 2016-10-30: qty 1
  Filled 2016-10-30: qty 2

## 2016-10-30 MED ORDER — MIDAZOLAM HCL 2 MG/2ML IJ SOLN
INTRAMUSCULAR | Status: AC
  Filled 2016-10-30: qty 2

## 2016-10-30 MED ORDER — SODIUM CHLORIDE 0.9 % IV SOLN
INTRAVENOUS | Status: DC | PRN
  Administered 2016-10-30: 1.75 mg/kg/h via INTRAVENOUS

## 2016-10-30 MED ORDER — TICAGRELOR 90 MG PO TABS
ORAL_TABLET | ORAL | Status: AC
  Filled 2016-10-30: qty 2

## 2016-10-30 MED ORDER — TIROFIBAN HCL IN NACL 5-0.9 MG/100ML-% IV SOLN
10.2000 ug/kg/min | INTRAVENOUS | Status: DC
  Administered 2016-10-30: 10.2 ug/kg/min via INTRAVENOUS

## 2016-10-30 MED ORDER — SODIUM CHLORIDE 0.9% FLUSH
3.0000 mL | INTRAVENOUS | Status: DC | PRN
  Administered 2016-11-04: 3 mL via INTRAVENOUS
  Filled 2016-10-30: qty 3

## 2016-10-30 MED ORDER — HEPARIN SODIUM (PORCINE) 5000 UNIT/ML IJ SOLN: 4000.0000 [IU] | Freq: Once | INTRAMUSCULAR | Status: DC

## 2016-10-30 MED ORDER — ONDANSETRON HCL 4 MG/2ML IJ SOLN
4.0000 mg | Freq: Four times a day (QID) | INTRAMUSCULAR | Status: DC | PRN
  Filled 2016-10-30: qty 2

## 2016-10-30 MED ORDER — ACETAMINOPHEN 325 MG PO TABS
650.0000 mg | ORAL_TABLET | ORAL | Status: DC | PRN
  Administered 2016-10-30 – 2016-11-01 (×2): 650 mg via ORAL
  Filled 2016-10-30 (×2): qty 2

## 2016-10-30 MED ORDER — TICAGRELOR 90 MG PO TABS
ORAL_TABLET | ORAL | Status: DC | PRN
  Administered 2016-10-30: 180 mg via ORAL

## 2016-10-30 MED ORDER — TIROFIBAN HCL IN NACL 5-0.9 MG/100ML-% IV SOLN
INTRAVENOUS | Status: AC | PRN
  Administered 2016-10-30: 0.075 ug/kg/min via INTRAVENOUS

## 2016-10-30 MED ORDER — SODIUM CHLORIDE 0.9 % WEIGHT BASED INFUSION
1.0000 mL/kg/h | INTRAVENOUS | Status: AC
  Administered 2016-10-30 – 2016-10-31 (×2): 1 mL/kg/h via INTRAVENOUS

## 2016-10-30 MED ORDER — LABETALOL HCL 5 MG/ML IV SOLN: 10.0000 mg | INTRAVENOUS | Status: AC | PRN

## 2016-10-30 MED ORDER — NITROGLYCERIN 5 MG/ML IV SOLN
INTRAVENOUS | Status: AC
  Filled 2016-10-30: qty 10

## 2016-10-30 MED ORDER — TIROFIBAN HCL IN NACL 5-0.9 MG/100ML-% IV SOLN
0.1500 ug/kg/min | INTRAVENOUS | Status: DC
  Administered 2016-10-30 – 2016-11-01 (×7): 0.15 ug/kg/min via INTRAVENOUS
  Filled 2016-10-30 (×9): qty 100

## 2016-10-30 MED ORDER — SODIUM CHLORIDE 0.9 % IV SOLN
INTRAVENOUS | Status: DC
  Administered 2016-11-01: 20 mL/h via INTRAVENOUS

## 2016-10-30 MED ORDER — FENTANYL CITRATE (PF) 100 MCG/2ML IJ SOLN
INTRAMUSCULAR | Status: DC | PRN
  Administered 2016-10-30: 50 ug via INTRAVENOUS
  Administered 2016-10-30: 25 ug via INTRAVENOUS

## 2016-10-30 MED ORDER — HEPARIN SODIUM (PORCINE) 5000 UNIT/ML IJ SOLN
4000.0000 [IU] | Freq: Once | INTRAMUSCULAR | Status: AC
  Administered 2016-10-30: 4000 [IU] via INTRAVENOUS

## 2016-10-30 MED ORDER — ASPIRIN 81 MG PO CHEW
81.0000 mg | CHEWABLE_TABLET | Freq: Every day | ORAL | Status: DC
  Administered 2016-11-01 – 2016-11-04 (×4): 81 mg via ORAL
  Filled 2016-10-30 (×4): qty 1

## 2016-10-30 MED ORDER — METOPROLOL TARTRATE 25 MG PO TABS
25.0000 mg | ORAL_TABLET | Freq: Two times a day (BID) | ORAL | Status: DC
  Administered 2016-10-30 – 2016-10-31 (×2): 25 mg via ORAL
  Filled 2016-10-30 (×3): qty 1

## 2016-10-30 MED ORDER — HYDRALAZINE HCL 20 MG/ML IJ SOLN: 5.0000 mg | INTRAMUSCULAR | Status: AC | PRN

## 2016-10-30 MED ORDER — TICAGRELOR 90 MG PO TABS
90.0000 mg | ORAL_TABLET | Freq: Two times a day (BID) | ORAL | Status: DC
  Administered 2016-10-31 (×2): 90 mg via ORAL
  Filled 2016-10-30 (×2): qty 1

## 2016-10-30 MED ORDER — SODIUM CHLORIDE 0.9 % IV SOLN: 250.0000 mL | INTRAVENOUS | Status: DC | PRN

## 2016-10-30 SURGICAL SUPPLY — 19 items
BALLN TREK RX 2.5X15 (BALLOONS) ×2
BALLN ~~LOC~~ TREK RX 4.0X15 (BALLOONS) ×2
BALLOON TREK RX 2.5X15 (BALLOONS) IMPLANT
BALLOON ~~LOC~~ TREK RX 4.0X15 (BALLOONS) IMPLANT
CATH 5FR JR4 DIAGNOSTIC (CATHETERS) ×1 IMPLANT
CATH INFINITI 5FR ANG PIGTAIL (CATHETERS) ×1 IMPLANT
CATH VISTA GUIDE 6FR XB3.5 SH (CATHETERS) ×1 IMPLANT
DEVICE CLOSURE MYNXGRIP 6/7F (Vascular Products) ×1 IMPLANT
DEVICE INFLAT 30 PLUS (MISCELLANEOUS) ×1 IMPLANT
DEVICE SAFEGUARD 24CM (GAUZE/BANDAGES/DRESSINGS) ×1 IMPLANT
KIT MANI 3VAL PERCEP (MISCELLANEOUS) ×2 IMPLANT
NDL PERC 18GX7CM (NEEDLE) IMPLANT
NEEDLE PERC 18GX7CM (NEEDLE) ×2 IMPLANT
PACK CARDIAC CATH (CUSTOM PROCEDURE TRAY) ×2 IMPLANT
SHEATH AVANTI 6FR X 11CM (SHEATH) ×1 IMPLANT
STENT XIENCE ALPINE RX 3.5X28 (Permanent Stent) ×1 IMPLANT
STENT XIENCE ALPINE RX 3.5X8 (Permanent Stent) ×1 IMPLANT
WIRE ASAHI PROWATER 180CM (WIRE) ×1 IMPLANT
WIRE EMERALD 3MM-J .035X150CM (WIRE) ×1 IMPLANT

## 2016-10-30 NOTE — ED Notes (Signed)
Dr. Odessa FlemingParschoes at bedside

## 2016-10-30 NOTE — H&P (Signed)
[employer] - [place] [facility]   PATIENT NAME: William Bates    MR#:  [id number]  DATE OF BIRTH:  [birth year]  DATE OF ADMISSION:  10/30/2016  PRIMARY CARE PHYSICIAN: William Perfect, MD (Inactive)   REQUESTING/REFERRING PHYSICIAN: Dr. Scotty Bates  CHIEF COMPLAINT:   Chief Complaint  Patient presents with  . Code STEMI    HISTORY OF PRESENT ILLNESS:  William Bates  is a 51 y.o. male with a known history of pre-diabetes, smoking presented to ED with worsening exrtional CP for 3 weeks. Found to have STEMI. Cath with PCI to LAD. Admitted to ICU. Smoker. No premature CAD in family  PAST MEDICAL HISTORY:   Past Medical History:  Diagnosis Date  . Alcohol abuse   . Pre-diabetes   . Tobacco use     PAST SURGICAL HISTORY:   Past Surgical History:  Procedure Laterality Date  . ANTERIOR CRUCIATE LIGAMENT REPAIR  [phone]   right  . APPENDECTOMY  1998    SOCIAL HISTORY:   Social History  Substance Use Topics  . Smoking status: Current Every Day Smoker  . Smokeless tobacco: Not on file     Comment: 1/2 ppd  . Alcohol use No    FAMILY HISTORY:   Family History  Problem Relation Age of Onset  . Arthritis Father   . Diabetes Father   . Hypertension Mother   . Prostate cancer Paternal Uncle   . Breast cancer Neg Hx   . Heart disease Neg Hx   . Colon cancer Neg Hx     DRUG ALLERGIES:  No Known Allergies  REVIEW OF SYSTEMS:   Review of Systems  Constitutional: Positive for malaise/fatigue. Negative for chills, fever and weight loss.  HENT: Negative for hearing loss and nosebleeds.   Eyes: Negative for blurred vision, double vision and pain.  Respiratory: Positive for shortness of breath. Negative for cough, hemoptysis, sputum production and wheezing.   Cardiovascular: Positive for chest pain. Negative for palpitations, orthopnea and leg swelling.  Gastrointestinal: Negative for abdominal pain, constipation, diarrhea, nausea and vomiting.   Genitourinary: Negative for dysuria and hematuria.  Musculoskeletal: Negative for back pain, falls and myalgias.  Skin: Negative for rash.  Neurological: Negative for dizziness, tremors, sensory change, speech change, focal weakness, seizures and headaches.  Endo/Heme/Allergies: Does not bruise/bleed easily.  Psychiatric/Behavioral: Negative for depression and memory loss. The patient is not nervous/anxious.     MEDICATIONS AT HOME:   Prior to Admission medications   Medication Sig Start Date End Date Taking? Authorizing Provider  acetaminophen (TYLENOL) 500 MG tablet Take 500 mg by mouth every 6 (six) hours as needed.   Yes [provider]  aspirin 325 MG tablet Take 325 mg by mouth every 4 (four) hours as needed.   Yes [provider]  naproxen sodium (ANAPROX) 220 MG tablet Take 220 mg by mouth 2 (two) times daily with a meal.   Yes [provider]  diazepam (VALIUM) 2 MG tablet Take 1 tablet (2 mg total) by mouth 3 (three) times daily. Patient not taking: Reported on 10/30/2016 10/18/15   William Feil, PA-C  meloxicam (MOBIC) 15 MG tablet Take 1 tablet (15 mg total) by mouth daily. Patient not taking: Reported on 10/30/2016 10/18/15   William Feil, PA-C  metaxalone (SKELAXIN) 800 MG tablet Take 1 tablet (800 mg total) by mouth 3 (three) times daily. Start after finishing Valium for muscle spasm Patient not taking: Reported on 10/30/2016 10/18/15  William Bates, William P, PA-C     VITAL SIGNS:  Blood pressure 114/80, pulse 65, temperature 97.9 F (36.6 C), temperature source Oral, resp. rate 16, height 6\' 1"  (1.854 m), weight 116.2 kg (256 lb 2.8 oz), SpO2 97 %.  PHYSICAL EXAMINATION:  Physical Exam  GENERAL:  51 y.o.-year-old patient lying in the bed with no acute distress.  EYES: Pupils equal, round, reactive to light and accommodation. No scleral icterus. Extraocular muscles intact.  HEENT: Head atraumatic, normocephalic. Oropharynx and nasopharynx  clear. No oropharyngeal erythema, moist oral mucosa  NECK:  Supple, no jugular venous distention. No thyroid enlargement, no tenderness.  LUNGS: Normal breath sounds bilaterally, no wheezing, rales, rhonchi. No use of accessory muscles of respiration.  CARDIOVASCULAR: S1, S2 normal. No murmurs, rubs, or gallops.  ABDOMEN: Soft, nontender, nondistended. Bowel sounds present. No organomegaly or mass.  EXTREMITIES: No pedal edema, cyanosis, or clubbing. + 2 pedal & radial pulses b/l.   NEUROLOGIC: Cranial nerves II through XII are intact. No focal Motor or sensory deficits appreciated b/l PSYCHIATRIC: The patient is alert and oriented x 3. Good affect.  SKIN: No obvious rash, lesion, or ulcer.   LABORATORY PANEL:   CBC  Recent Labs Lab 10/30/16 1610  WBC 12.6*  HGB 15.5  HCT 45.8  PLT 208   ------------------------------------------------------------------------------------------------------------------  Chemistries   Recent Labs Lab 10/30/16 1610  NA 138  K 4.1  CL 106  CO2 26  GLUCOSE 158*  BUN 20  CREATININE 1.48*  CALCIUM 9.1  AST 23  ALT 31  ALKPHOS 138*  BILITOT 0.5   ------------------------------------------------------------------------------------------------------------------  Cardiac Enzymes  Recent Labs Lab 10/30/16 1610  TROPONINI 0.08*   ------------------------------------------------------------------------------------------------------------------  RADIOLOGY:  Dg Chest Port 1 View  Result Date: 10/30/2016 CLINICAL DATA:  STEMI in progress with chest pain and dyspnea. Concern for dissection. EXAM: PORTABLE CHEST 1 VIEW COMPARISON:  None. FINDINGS: The heart size and mediastinal contours are within normal limits. No mediastinal widening. External defibrillator paddles project over the left hemithorax and cardiac silhouette. No pneumothorax or pneumonic consolidation. Slight central vascular congestion. The visualized skeletal structures are  unremarkable. IMPRESSION: No mediastinal widening. Slight vascular congestion. No acute pulmonary disease. Electronically Signed   By: William Bates  Kwon M.D.   On: 10/30/2016 16:30     IMPRESSION AND PLAN:   * STEMI PCI to LAD. ASA, Tirofiban Statin. Started on metoprolol Check FLP tomorrow AM  * Pre-diabetes Check Hb A1c  * Tobacco abuse Counseled to quit. Time spent > 3minutes   All the records are reviewed and case discussed with ED provider. Management plans discussed with the patient, family and they are in agreement.  CODE STATUS: FULL CODE  TOTAL TIME TAKING CARE OF THIS PATIENT: 40 minutes.   Milagros LollSudini, Ayane Delancey R M.D on 10/30/2016 at 6:32 PM  Between 7am to 6pm - Pager - [phone]  After 6pm go to [email or web address] - password [email or web address] [facility]  [facility]  Office  [phone]  CC: Primary care physician; William PerfectHodgin, Thomas W, MD (Inactive)  Note: This dictation was prepared with Dragon dictation along with smaller phrase technology. Any transcriptional errors that result from this process are unintentional.

## 2016-10-30 NOTE — ED Notes (Addendum)
Pt arrives via EMS from work, pt developed mid sternal CP, states he has been complaining of CP for 1 month, STEMI called in field, EMS gave 3 nitro sprays, 50 mcg of fentayl and 324 ASA, IVs established

## 2016-10-30 NOTE — ED Notes (Signed)
Pt taken to cath lab

## 2016-10-30 NOTE — Progress Notes (Signed)
Name: William Bates MRN: [id number] DOB: 1965-11-18    ADMISSION DATE:  10/30/2016  CHIEF COMPLAINT: "Crushing Chest pain"  BRIEF PATIENT DESCRIPTION: 51 year old male with STEMI S/P PCI to  LAD  SIGNIFICANT EVENTS  5/29>> Patient admitted to the ICU with STEMI S/P PCI to LAD  STUDIES:  5/29 ECHO>>   HISTORY OF PRESENT ILLNESS:  William Bates is a 51 year old male with known history of alcohol abuse, tobacco abuse, Pre-diabetes. On 5/29 patient called the EMS as he was experiencing crushing chest pain associated with diaphoresis. 12-lead EKG was performed which was concerning for anterior MI. He was given aspirin, nitroglycerin and fentanyl in the ED and code STEMI was activated. The patient underwent PCI to LAD. Patient was sent to the ICU for observation overnight.  PAST MEDICAL HISTORY :   has a past medical history of Alcohol abuse; Pre-diabetes; and Tobacco use.  has a past surgical history that includes Appendectomy (1998) and Anterior cruciate ligament repair ([id number]). Prior to Admission medications   Medication Sig Start Date End Date Taking? Authorizing Provider  acetaminophen (TYLENOL) 500 MG tablet Take 500 mg by mouth every 6 (six) hours as needed.   Yes [provider]  aspirin 325 MG tablet Take 325 mg by mouth every 4 (four) hours as needed.   Yes [provider]  naproxen sodium (ANAPROX) 220 MG tablet Take 220 mg by mouth 2 (two) times daily with a meal.   Yes [provider]  diazepam (VALIUM) 2 MG tablet Take 1 tablet (2 mg total) by mouth 3 (three) times daily. Patient not taking: Reported on 10/30/2016 10/18/15   Lutricia Feil, PA-C  meloxicam (MOBIC) 15 MG tablet Take 1 tablet (15 mg total) by mouth daily. Patient not taking: Reported on 10/30/2016 10/18/15   Lutricia Feil, PA-C  metaxalone (SKELAXIN) 800 MG tablet Take 1 tablet (800 mg total) by mouth 3 (three) times daily. Start after finishing Valium for muscle spasm Patient  not taking: Reported on 10/30/2016 10/18/15   Lutricia Feil, PA-C   No Known Allergies  FAMILY HISTORY:  family history includes Arthritis in his father; Diabetes in his father; Hypertension in his mother; Prostate cancer in his paternal uncle. SOCIAL HISTORY:  reports that he has been smoking.  He does not have any smokeless tobacco history on file. He reports that he does not drink alcohol or use drugs.  REVIEW OF SYSTEMS:   Constitutional: Negative for fever, chills, weight loss, malaise/fatigue and diaphoresis.  HENT: Negative for hearing loss, ear pain, nosebleeds, congestion, sore throat, neck pain, tinnitus and ear discharge.   Eyes: Negative for blurred vision, double vision, photophobia, pain, discharge and redness.  Respiratory: Negative for cough, hemoptysis, sputum production, shortness of breath, wheezing and stridor.   Cardiovascular: Negative for chest pain, palpitations, orthopnea, claudication, leg swelling and PND.  Gastrointestinal: Negative for heartburn, nausea, vomiting, abdominal pain, diarrhea, constipation, blood in stool and melena.  Genitourinary: Negative for dysuria, urgency, frequency, hematuria and flank pain.  Musculoskeletal: Negative for myalgias, back pain, joint pain and falls.  Skin: Negative for itching and rash.  Neurological: Negative for dizziness, tingling, tremors, sensory change, speech change, focal weakness, seizures, loss of consciousness, weakness and headaches.  Endo/Heme/Allergies: Negative for environmental allergies and polydipsia. Does not bruise/bleed easily.  SUBJECTIVE: Patient states that" he is experiencing some pain but feels better"  VITAL SIGNS: Temp:  [97.9 F (36.6 C)] 97.9 F (36.6 C) (05/29 1811) Pulse Rate:  [63-76]  63 (05/29 2000) Resp:  [16-22] 20 (05/29 2000) BP: (114-153)/(80-91) 115/83 (05/29 2000) SpO2:  [97 %-98 %] 97 % (05/29 2000) Weight:  [256 lb 2.8 oz (116.2 kg)] 256 lb 2.8 oz (116.2 kg) (05/29  1811)  PHYSICAL EXAMINATION: General: Middle-aged, well-built, well-nourished , in no acute distress Neuro:  Awake, alert, oriented HEENT:  Atraumatic, normocephalic, no JVD Cardiovascular:  S1 and S2, regular, no m/r/g noted Lungs:  Clear bilaterally, no wheezes, crackles, rhonchi noted Abdomen: soft, Non tender,+BS Musculoskeletal:  No edema, cyanosis noted Skin:  Warm, dry and intact   Recent Labs Lab 10/30/16 1610  NA 138  K 4.1  CL 106  CO2 26  BUN 20  CREATININE 1.48*  GLUCOSE 158*    Recent Labs Lab 10/30/16 1610  HGB 15.5  HCT 45.8  WBC 12.6*  PLT 208   Dg Chest Port 1 View  Result Date: 10/30/2016 CLINICAL DATA:  STEMI in progress with chest pain and dyspnea. Concern for dissection. EXAM: PORTABLE CHEST 1 VIEW COMPARISON:  None. FINDINGS: The heart size and mediastinal contours are within normal limits. No mediastinal widening. External defibrillator paddles project over the left hemithorax and cardiac silhouette. No pneumothorax or pneumonic consolidation. Slight central vascular congestion. The visualized skeletal structures are unremarkable. IMPRESSION: No mediastinal widening. Slight vascular congestion. No acute pulmonary disease. Electronically Signed   By: Tollie Ethavid  Kwon M.D.   On: 10/30/2016 16:30    ASSESSMENT / PLAN:  STEMI S/P PCI to LAD Tobacco abuse  Plan EKG-S/P PCI Follow Troponin Continue Aspirin Continue Ticagrelor Follow cardiology recommendation  Continue Atorvastatin Counseled for Tobacco cessation    Bincy Varughese,AG-ACNP Pulmonary and Critical Care Medicine [facility]   10/30/2016, 10:03 PM

## 2016-10-30 NOTE — Progress Notes (Signed)
CH responded to a Code Stemi for ED-12. Pt was in pain upon arrival. MD and medical team evaluated Pt and sent him to Cath lab. CH escorted Girl Friend to special recovery area. CH notified Cath Lab personnel of her presence. CH provided comfort elements for girl friend and informed her [facility]. [facility]    10/30/16 1600  Clinical Encounter Type  Visited With Patient;Patient and family together;Health care provider  Visit Type Initial;Spiritual support;Code;ED (Code Stemi)  Referral From Nurse  Consult/Referral To Chaplain  Spiritual Encounters  Spiritual Needs Emotional

## 2016-10-30 NOTE — ED Provider Notes (Signed)
[facility] Emergency Department Provider Note    None    (approximate)  I have reviewed the triage vital signs and the nursing notes.   HISTORY  Chief Complaint Code STEMI    HPI Trudee GripSteven J Strehlow is a 51 y.o. male with history of smoking presents with stuttering chest pain over the past month. Patient caught EMS today because right around noon he started having severe crushing chest pain associated with diaphoresis. There is no significant radiation of the pain but he did feel some achiness in his jaw. States that yesterday he was working out and had worsening exertional dyspnea. His girlfriend has been encouraging him to go to the doctor but he has not done this.Patient does have a history of prediabetes not on any medication. No history of hypertension or hypercholesterolemia. No previous history of heart attacks. EMS performed a 12-lead EKG that showed evidence of anterior MI.  He was given aspirin, nitroglycerin and fentanyl.  Past Medical History:  Diagnosis Date  . Alcohol abuse    Family History  Problem Relation Age of Onset  . Arthritis Father   . Hypertension Mother   . Diabetes Father   . Prostate cancer Paternal Uncle   . Breast cancer Neg Hx   . Heart disease Neg Hx   . Colon cancer Neg Hx    Past Surgical History:  Procedure Laterality Date  . ANTERIOR CRUCIATE LIGAMENT REPAIR  [phone]   right  . APPENDECTOMY  1998   Patient Active Problem List   Diagnosis Date Noted  . Sinusitis 11/17/2012  . Tobacco use disorder 06/30/2011      Prior to Admission medications   Medication Sig Start Date End Date Taking? Authorizing Provider  acetaminophen (TYLENOL) 500 MG tablet Take 500 mg by mouth every 6 (six) hours as needed.   Yes [provider]  aspirin 325 MG tablet Take 325 mg by mouth every 4 (four) hours as needed.   Yes [provider]  naproxen sodium (ANAPROX) 220 MG tablet Take 220 mg by mouth 2 (two) times  daily with a meal.   Yes [provider]  diazepam (VALIUM) 2 MG tablet Take 1 tablet (2 mg total) by mouth 3 (three) times daily. Patient not taking: Reported on 10/30/2016 10/18/15   Lutricia Feiloemer, William P, PA-C  meloxicam (MOBIC) 15 MG tablet Take 1 tablet (15 mg total) by mouth daily. Patient not taking: Reported on 10/30/2016 10/18/15   Lutricia Feiloemer, William P, PA-C  metaxalone (SKELAXIN) 800 MG tablet Take 1 tablet (800 mg total) by mouth 3 (three) times daily. Start after finishing Valium for muscle spasm Patient not taking: Reported on 10/30/2016 10/18/15   Lutricia Feiloemer, William P, PA-C    Allergies Patient has no known allergies.    Social History Social History  Substance Use Topics  . Smoking status: Current Every Day Smoker  . Smokeless tobacco: Not on file     Comment: 1/2 ppd  . Alcohol use No    Review of Systems Patient denies headaches, rhinorrhea, blurry vision, numbness, shortness of breath, chest pain, edema, cough, abdominal pain, nausea, vomiting, diarrhea, dysuria, fevers, rashes or hallucinations unless otherwise stated above in HPI. ____________________________________________   PHYSICAL EXAM:  VITAL SIGNS: Vitals:   10/30/16 1608  BP: (!) 153/87  Pulse: 76  Resp: 16    Constitutional: Alert and oriented.  in no acute distress. Eyes: Conjunctivae are normal.  Head: Atraumatic. Nose: No congestion/rhinnorhea. Mouth/Throat: Mucous membranes are moist.  Neck: No stridor. Painless ROM.  Cardiovascular: Normal rate, regular rhythm. Grossly normal heart sounds.  Good peripheral circulation. Respiratory: Normal respiratory effort.  No retractions. Lungs CTAB. Gastrointestinal: Soft and nontender. No distention. No abdominal bruits. No CVA tenderness. Genitourinary:  Musculoskeletal: No lower extremity tenderness nor edema.  No joint effusions. Neurologic:  Normal speech and language. No gross focal neurologic deficits are appreciated. No facial droop Skin:  Skin  is warm, dry and intact. No rash noted. Psychiatric: Mood and affect are normal. Speech and behavior are normal.  ____________________________________________   LABS (all labs ordered are listed, but only abnormal results are displayed)  No results found for this or any previous visit (from the past 24 hour(s)). ____________________________________________  EKG EMS EKG: My review and personal interpretation at Time: 15:37   Indication: chest pain  Rate: 125  Rhythm: sinus rhythm with a 5 Axis:  Other:    My review and personal interpretation at Time: 16:09   Indication: chet pain  Rate: 70  Rhythm: sinus Axis: normal Other: evolving STEMI changes in anterior distribution ____________________________________________  RADIOLOGY  I personally reviewed all radiographic images ordered to evaluate for the above acute complaints and reviewed radiology reports and findings.  These findings were personally discussed with the patient.  Please see medical record for radiology report.  ____________________________________________   PROCEDURES  Procedure(s) performed:  Procedures    Critical Care performed: yes  ____________________________________________   INITIAL IMPRESSION / ASSESSMENT AND PLAN / ED COURSE  Pertinent labs & imaging results that were available during my care of the patient were reviewed by me and considered in my medical decision making (see chart for details).  DDX: STEMI, NSTEMI< dissection  ESTES LEHNER is a 51 y.o. who presents to the ED with chest pain as described above. Patient afebrile mildly hypertensive but not tachycardic on arrival. EKG is consistent with ST elevation MI. Patient currently protecting his airway. Cardiac catheter lab was notified prior to arrival. Cardiology at bedside and agrees with taking patient for emergent catheter. Chest x-ray shows mid no mediastinal widening to suggest any dissection. Patient started on a heparin drip.  Received aspirin prior to arrival.   Have discussed with the patient and available family all diagnostics and treatments performed thus far and all questions were answered to the best of my ability. The patient demonstrates understanding and agreement with plan.       ____________________________________________   FINAL CLINICAL IMPRESSION(S) / ED DIAGNOSES  Final diagnoses:  ST elevation myocardial infarction (STEMI), unspecified artery (HCC)  Chest pain, unspecified type      NEW MEDICATIONS STARTED DURING THIS VISIT:  Current Discharge Medication List       Note:  This document was prepared using Dragon voice recognition software and may include unintentional dictation errors.    Willy Eddy, MD 10/30/16 [phone]

## 2016-10-30 NOTE — Progress Notes (Signed)
eLink Physician-Brief Progress Note Patient Name: William Bates DOB: 08-Aug-1965 MRN: [id number]   Date of Service  10/30/2016  HPI/Events of Note  New patient admitted with STEMI. Underwent PCI to LAD. Camera check shows patient resting comfortably on room air. Vital signs stable.   eICU Interventions  Continuing current plan of care as per primary service.      Intervention Category Evaluation Type: New Patient Evaluation  Lawanda CousinsJennings Maiah Sinning 10/30/2016, 6:51 PM

## 2016-10-31 ENCOUNTER — Encounter: Payer: Self-pay | Admitting: Cardiology

## 2016-10-31 ENCOUNTER — Inpatient Hospital Stay: Payer: 59

## 2016-10-31 ENCOUNTER — Encounter
Admission: EM | Disposition: A | Discharge status: Home / Self Care | Payer: Self-pay | Source: Home / Self Care | Attending: Internal Medicine

## 2016-10-31 DIAGNOSIS — I469 Cardiac arrest, cause unspecified: Secondary | ICD-10-CM

## 2016-10-31 DIAGNOSIS — I2102 ST elevation (STEMI) myocardial infarction involving left anterior descending coronary artery: Principal | ICD-10-CM

## 2016-10-31 DIAGNOSIS — R079 Chest pain, unspecified: Secondary | ICD-10-CM

## 2016-10-31 HISTORY — PX: CORONARY STENT INTERVENTION: CATH118234

## 2016-10-31 HISTORY — PX: LEFT HEART CATH AND CORONARY ANGIOGRAPHY: CATH118249

## 2016-10-31 LAB — BASIC METABOLIC PANEL
Anion gap: 7 (ref 5–15)
BUN: 19 mg/dL (ref 6–20)
CHLORIDE: 110 mmol/L (ref 101–111)
CO2: 22 mmol/L (ref 22–32)
CREATININE: 0.88 mg/dL (ref 0.61–1.24)
Calcium: 8.2 mg/dL — ABNORMAL LOW (ref 8.9–10.3)
GFR calc non Af Amer: >60 mL/min (ref >60)
Glucose, Bld: 144 mg/dL — ABNORMAL HIGH (ref 65–99)
Potassium: 3.9 mmol/L (ref 3.5–5.1)
SODIUM: 139 mmol/L (ref 135–145)

## 2016-10-31 LAB — ECHOCARDIOGRAM COMPLETE
Height: 73 in
Weight: 4098.79 oz

## 2016-10-31 LAB — CBC
HCT: 42.1 % (ref 40.0–52.0)
Hemoglobin: 14.6 g/dL (ref 13.0–18.0)
MCH: 29.5 pg (ref 26.0–34.0)
MCHC: 34.7 g/dL (ref 32.0–36.0)
MCV: 85.2 fL (ref 80.0–100.0)
PLATELETS: 201 10*3/uL (ref 150–440)
RBC: 4.94 MIL/uL (ref 4.40–5.90)
RDW: 13.8 % (ref 11.5–14.5)
WBC: 12.3 10*3/uL — ABNORMAL HIGH (ref 3.8–10.6)

## 2016-10-31 LAB — LIPID PANEL
CHOL/HDL RATIO: 5.3 ratio
Cholesterol: 160 mg/dL (ref 0–200)
HDL: 30 mg/dL — AB (ref >40)
LDL Cholesterol: 103 mg/dL — ABNORMAL HIGH (ref 0–99)
TRIGLYCERIDES: 136 mg/dL (ref <150)
VLDL: 27 mg/dL (ref 0–40)

## 2016-10-31 LAB — URINE DRUG SCREEN, QUALITATIVE (ARMC ONLY)
Amphetamines, Ur Screen: POSITIVE — AB
Barbiturates, Ur Screen: NOT DETECTED
Benzodiazepine, Ur Scrn: POSITIVE — AB
CANNABINOID 50 NG, UR ~~LOC~~: NOT DETECTED
COCAINE METABOLITE, UR ~~LOC~~: NOT DETECTED
MDMA (ECSTASY) UR SCREEN: NOT DETECTED
Methadone Scn, Ur: NOT DETECTED
Opiate, Ur Screen: NOT DETECTED
Phencyclidine (PCP) Ur S: NOT DETECTED
TRICYCLIC, UR SCREEN: NOT DETECTED

## 2016-10-31 LAB — TROPONIN I
Troponin I: >65 ng/mL (ref <0.03)
Troponin I: >65 ng/mL (ref <0.03)

## 2016-10-31 LAB — MRSA PCR SCREENING: MRSA BY PCR: NEGATIVE

## 2016-10-31 LAB — GLUCOSE, CAPILLARY
Glucose-Capillary: 142 mg/dL — ABNORMAL HIGH (ref 65–99)
Glucose-Capillary: 149 mg/dL — ABNORMAL HIGH (ref 65–99)

## 2016-10-31 LAB — TRIGLYCERIDES: TRIGLYCERIDES: 136 mg/dL (ref <150)

## 2016-10-31 SURGERY — LEFT HEART CATH AND CORONARY ANGIOGRAPHY
Anesthesia: Moderate Sedation

## 2016-10-31 MED ORDER — MIDAZOLAM HCL 2 MG/2ML IJ SOLN
INTRAMUSCULAR | Status: AC
  Filled 2016-10-31: qty 4

## 2016-10-31 MED ORDER — SODIUM CHLORIDE 0.9 % IV BOLUS (SEPSIS)
250.0000 mL | Freq: Once | INTRAVENOUS | Status: AC
  Administered 2016-10-31: 250 mL via INTRAVENOUS

## 2016-10-31 MED ORDER — SODIUM CHLORIDE 0.9 % IV SOLN: 250.0000 mL | INTRAVENOUS | Status: DC | PRN

## 2016-10-31 MED ORDER — PROPOFOL 1000 MG/100ML IV EMUL
0.0000 ug/kg/min | INTRAVENOUS | Status: DC
Start: 2016-10-31 — End: 2016-10-31
  Administered 2016-10-31: 20 ug/kg/min via INTRAVENOUS

## 2016-10-31 MED ORDER — FENTANYL CITRATE (PF) 100 MCG/2ML IJ SOLN
INTRAMUSCULAR | Status: AC
  Filled 2016-10-31: qty 2

## 2016-10-31 MED ORDER — SODIUM CHLORIDE 0.9 % WEIGHT BASED INFUSION
1.0000 mL/kg/h | INTRAVENOUS | Status: AC
  Administered 2016-10-31: 1 mL/kg/h via INTRAVENOUS

## 2016-10-31 MED ORDER — ONDANSETRON HCL 4 MG/2ML IJ SOLN
4.0000 mg | Freq: Four times a day (QID) | INTRAMUSCULAR | Status: DC | PRN
Start: 2016-10-31 — End: 2016-11-04
  Administered 2016-10-31: 4 mg via INTRAVENOUS

## 2016-10-31 MED ORDER — SODIUM CHLORIDE 0.9% FLUSH
3.0000 mL | Freq: Two times a day (BID) | INTRAVENOUS | Status: DC
  Administered 2016-10-31 – 2016-11-04 (×7): 3 mL via INTRAVENOUS

## 2016-10-31 MED ORDER — BIVALIRUDIN TRIFLUOROACETATE 250 MG IV SOLR
INTRAVENOUS | Status: AC
  Filled 2016-10-31: qty 250

## 2016-10-31 MED ORDER — SODIUM CHLORIDE 0.9% FLUSH: 3.0000 mL | INTRAVENOUS | Status: DC | PRN

## 2016-10-31 MED ORDER — MIDAZOLAM HCL 2 MG/2ML IJ SOLN
4.0000 mg | Freq: Once | INTRAMUSCULAR | Status: DC
  Administered 2016-10-31: 4 mg via INTRAVENOUS

## 2016-10-31 MED ORDER — FENTANYL CITRATE (PF) 100 MCG/2ML IJ SOLN
25.0000 ug | INTRAMUSCULAR | Status: DC | PRN
  Administered 2016-10-31: 75 ug via INTRAVENOUS

## 2016-10-31 MED ORDER — AMIODARONE IV BOLUS ONLY 150 MG/100ML
150.0000 mg | Freq: Once | INTRAVENOUS | Status: DC
  Administered 2016-10-31: 150 mg via INTRAVENOUS
  Filled 2016-10-31: qty 100

## 2016-10-31 MED ORDER — AMIODARONE HCL IN DEXTROSE 360-4.14 MG/200ML-% IV SOLN
60.0000 mg/h | INTRAVENOUS | Status: AC
  Administered 2016-10-31: 60 mg/h via INTRAVENOUS
  Filled 2016-10-31: qty 200

## 2016-10-31 MED ORDER — IOPAMIDOL (ISOVUE-300) INJECTION 61%
INTRAVENOUS | Status: DC | PRN
  Administered 2016-10-31: 100 mL via INTRA_ARTERIAL

## 2016-10-31 MED ORDER — ACETAMINOPHEN 325 MG PO TABS
650.0000 mg | ORAL_TABLET | ORAL | Status: DC | PRN
  Administered 2016-11-01 – 2016-11-02 (×3): 650 mg via ORAL
  Filled 2016-10-31 (×3): qty 2

## 2016-10-31 MED ORDER — MIDAZOLAM HCL 2 MG/2ML IJ SOLN
INTRAMUSCULAR | Status: AC
  Filled 2016-10-31: qty 2

## 2016-10-31 MED ORDER — LISINOPRIL 5 MG PO TABS
5.0000 mg | ORAL_TABLET | Freq: Every day | ORAL | Status: DC
  Administered 2016-11-02 – 2016-11-03 (×2): 5 mg via ORAL
  Filled 2016-10-31 (×4): qty 1

## 2016-10-31 MED ORDER — AMIODARONE HCL IN DEXTROSE 360-4.14 MG/200ML-% IV SOLN
30.0000 mg/h | INTRAVENOUS | Status: DC
  Administered 2016-10-31 – 2016-11-02 (×4): 30 mg/h via INTRAVENOUS
  Filled 2016-10-31 (×5): qty 200

## 2016-10-31 MED ORDER — PHENOL 1.4 % MT LIQD: 2.0000 | OROMUCOSAL | Status: DC | PRN

## 2016-10-31 MED ORDER — VECURONIUM BROMIDE 10 MG IV SOLR
INTRAVENOUS | Status: AC
  Filled 2016-10-31: qty 10

## 2016-10-31 SURGICAL SUPPLY — 11 items
CATH INFINITI JR4 5F (CATHETERS) ×1 IMPLANT
CATH VISTA GUIDE 6FR XB3.5 SH (CATHETERS) ×1 IMPLANT
DEVICE CLOSURE MYNXGRIP 6/7F (Vascular Products) ×1 IMPLANT
DEVICE INFLAT 30 PLUS (MISCELLANEOUS) ×1 IMPLANT
KIT MANI 3VAL PERCEP (MISCELLANEOUS) ×2 IMPLANT
NDL PERC 18GX7CM (NEEDLE) IMPLANT
NEEDLE PERC 18GX7CM (NEEDLE) ×2 IMPLANT
PACK CARDIAC CATH (CUSTOM PROCEDURE TRAY) ×2 IMPLANT
SHEATH AVANTI 6FR X 11CM (SHEATH) ×1 IMPLANT
WIRE ASAHI PROWATER 180CM (WIRE) IMPLANT
WIRE EMERALD 3MM-J .035X150CM (WIRE) ×1 IMPLANT

## 2016-10-31 NOTE — Progress Notes (Signed)
Repeat EKG was done with no real changes noted.  Per NP, continue to monitor.

## 2016-10-31 NOTE — Progress Notes (Signed)
[employer] [facility] [place] [facility]   PATIENT NAME: William Bates    MR#:  [id number]  DATE OF BIRTH:  1965/11/07  SUBJECTIVE:  CHIEF COMPLAINT:   Chief Complaint  Patient presents with  . Code STEMI   Came with STEMI- s/p stent 10/30/16- had seizures and v tech- code blue activated in 10/31/16 morning , intubated and started on amiodarone drip. When I saw at 6512- pt was on ventilator.  REVIEW OF SYSTEMS:     Review of Systems  Unable to perform ROS: Critical illness    DRUG ALLERGIES:  No Known Allergies  VITALS:  Blood pressure 110/78, pulse (!) 53, temperature 98.2 F (36.8 C), temperature source Oral, resp. rate (!) 21, height 6\' 1"  (1.854 m), weight 116.2 kg (256 lb 2.8 oz), SpO2 99 %.  PHYSICAL EXAMINATION:  GENERAL:  51 y.o.-year-old patient lying in the bed with no acute distress.  EYES: Pupils equal, round, reactive to light . No scleral icterus. Extraocular muscles intact.  HEENT: Head atraumatic, normocephalic. Oropharynx and nasopharynx clear.  NECK:  Supple, no jugular venous distention. No thyroid enlargement, no tenderness.  LUNGS: Normal breath sounds bilaterally, no wheezing, rales,rhonchi or crepitation. No use of accessory muscles of respiration.  CARDIOVASCULAR: S1, S2 normal. No murmurs, rubs, or gallops.  ABDOMEN: Soft, nontender, nondistended. Bowel sounds present. No organomegaly or mass.  EXTREMITIES: No pedal edema, cyanosis, or clubbing.  NEUROLOGIC: Pt is intubated and tapering sedation now. PSYCHIATRIC: The patient is sedated.  SKIN: No obvious rash, lesion, or ulcer.   Physical Exam LABORATORY PANEL:   CBC  Recent Labs Lab 10/31/16 0407  WBC 12.3*  HGB 14.6  HCT 42.1  PLT 201   ------------------------------------------------------------------------------------------------------------------  Chemistries   Recent Labs Lab 10/30/16 1610 10/31/16 0407  NA 138 139  K 4.1 3.9  CL 106 110  CO2 26 22  GLUCOSE  158* 144*  BUN 20 19  CREATININE 1.48* 0.88  CALCIUM 9.1 8.2*  AST 23  --   ALT 31  --   ALKPHOS 138*  --   BILITOT 0.5  --    ------------------------------------------------------------------------------------------------------------------  Cardiac Enzymes  Recent Labs Lab 10/31/16 0052 10/31/16 0407  TROPONINI >65.00* >65.00*   ------------------------------------------------------------------------------------------------------------------  RADIOLOGY:  Dg Chest Port 1 View  Result Date: 10/30/2016 CLINICAL DATA:  STEMI in progress with chest pain and dyspnea. Concern for dissection. EXAM: PORTABLE CHEST 1 VIEW COMPARISON:  None. FINDINGS: The heart size and mediastinal contours are within normal limits. No mediastinal widening. External defibrillator paddles project over the left hemithorax and cardiac silhouette. No pneumothorax or pneumonic consolidation. Slight central vascular congestion. The visualized skeletal structures are unremarkable. IMPRESSION: No mediastinal widening. Slight vascular congestion. No acute pulmonary disease. Electronically Signed   By: Tollie Ethavid  Kwon M.D.   On: 10/30/2016 16:30   Dg Abd Portable 1v  Result Date: 10/31/2016 CLINICAL DATA:  Orogastric tube placement. EXAM: PORTABLE ABDOMEN - 1 VIEW COMPARISON:  10/18/2015. FINDINGS: NG tube noted with tip projected over superior vena cava. Degenerative changes lumbar spine. No acute or focal bony abnormality identified. Bibasilar subsegmental atelectasis and/or infiltrates. IMPRESSION: 1. NG tube noted with its tip projected over superior vena cava. 2.  Right bibasilar subsegmental atelectasis and/or infiltrates. Electronically Signed   By: Maisie Fushomas  Register   On: 10/31/2016 12:29    ASSESSMENT AND PLAN:   Active Problems:   STEMI (ST elevation myocardial infarction) (HCC)   Chest pain   Cardiac arrest, cause unspecified (HCC)   *  STEMI   S/p setnt, follow per cardiology.   meds per cardiology. (  antiplatelets and B Blocker)  * v tech- post MI   Taken for cath again on 10/31/16- no new lesions.   Responded to cardioversion and then amio drip.   Manage per cardio.    Intubated during event, but plan for quick extubation.  * seizures    Not sure , if seizures really present or not.   ICU physician suggested no meds at this time.  * hyperlipidemia   Atorvastatin.  * Hypertension   Lisinopril and metoprolol.        All the records are reviewed and case discussed with Care Management/Social Workerr. Management plans discussed with the patient, family and they are in agreement.  CODE STATUS: Full.  TOTAL TIME TAKING CARE OF THIS PATIENT: 35 minutes.     POSSIBLE D/C IN 1-2 DAYS, DEPENDING ON CLINICAL CONDITION.   Altamese Dilling M.D on 10/31/2016   Between 7am to 6pm - Pager - [phone]  After 6pm go to [email or web address] - password [email or web address]  [facility] [place] [facility]  Office  [phone]  CC: Primary care physician; Edwyna Perfect, MD (Inactive)  Note: This dictation was prepared with Dragon dictation along with smaller phrase technology. Any transcriptional errors that result from this process are unintentional.

## 2016-10-31 NOTE — Procedures (Signed)
Endotracheal Intubation: Patient required placement of an artificial airway secondary to resp failure.   Consent: Emergent.   Hand washing performed prior to starting the procedure.   Medications administered for sedation prior to procedure: Midazolam 4 mg IV,  Vecuronium 10 mg IV.  Procedure: A time out procedure was called and correct patient, name, & ID confirmed. Needed supplies and equipment were assembled and checked to include ETT, 10 ml syringe, Glidescope, Mac and Miller blades, suction, oxygen and bag mask valve, end tidal CO2 monitor. Patient was positioned to align the mouth and pharynx to facilitate visualization of the glottis.  Heart rate, SpO2 and blood pressure was continuously monitored during the procedure. Pre-oxygenation was conducted prior to intubation and endotracheal tube was placed through the vocal cords into the trachea.    The artificial airway was placed under direct visualization via glidescope route using ETT on the first attempt.    ETT was secured at 24 cm mark.    Placement was confirmed by auscuitation of lungs with good breath sounds bilaterally and no stomach sounds.  Condensation was noted on endotracheal tube.  Pulse ox %.  CO2 detector in place with appropriate color change.   Complications: None .   Operator: Ashby Dawes.    Marda Stalker, M.D.  10/31/2016

## 2016-10-31 NOTE — Code Documentation (Addendum)
   RN noted pt had seizure like activity, unresponsiveness. MD came to bedside, noted that pt was unresponsive but had some spontaneous movements of upper extremities. Code blue was called and CPR was initiated at 9:39 am. Pt's HR was 207.  Glucose was normal.  Noted Vtach/Vfib on monitor, shock x1> return to sinus tach, some improved responsiveness.  given Mg 2g and Calcium1. Amio 300 plus infusion.  Lost pulse soon, CPR reinitiated. Rhythm check; no pulse but Vfib; shocked again with return to sinus rhythm and SRC at about 9:49 AM.   Pt MS began to improve and he began to awaken. Pt d/w cardiologist, determined need to return to cath lab. Pt then intubated, started on amio drip, levophed ordered but not started due to stable BP. Continued on IVF.  Pt then taken to cath lab.  --------------  S: Pt is a [age] yo male was underwent a cardiac cath yesterday due to chest pain. He had overlapping drug-eluting stents in the proximal LAD with resolution of chest pain.  A/P Acute MI, s/p DES to LAD.  V-fib cardiac arrest, defibrillation x 2, received Amio bolus and infusion.  Monitor electrolytes.  Intubated emergently for return to cath lab. D/w cardiology.  Continue amiodarone, anticoagulation per cardiology recs.  Wean vent after return from cath lab if otherwise stable. Vent settings reviewed.    Wells Guiles-Deep Kassie Keng, M.D.  10/31/2016  Critical Care Attestation.  I have personally obtained a history, examined the patient, evaluated laboratory and imaging results, formulated the assessment and plan and placed orders. The Patient requires high complexity decision making for assessment and support, frequent evaluation and titration of therapies, application of advanced monitoring technologies and extensive interpretation of multiple databases. The patient has critical illness that could lead imminently to failure of 1 or more organ systems and requires the highest level of physician preparedness to  intervene.  Critical Care Time devoted to patient care services described in this note is 45 minutes and is exclusive of time spent in procedures.

## 2016-10-31 NOTE — Consult Note (Signed)
[facility] Cardiology  CARDIOLOGY CONSULT NOTE  Patient ID: William Bates MRN: [id number] DOB/AGE: Jun 11, 1965 50 y.o.  Admit date: 10/30/2016 Referring Physician [facility] Primary Physician Primary Cardiologist  Reason for Consultation Anterior STEMI  HPI: 50 year old gentleman referred for evaluation of anterior STEMI. The patient has a three-week history of intermittent upper chest discomfort, was evaluated at a walk-in clinic, treated for allergies. The patient presented to [facility] emergency room on 10/30/2016 with more severe and prolonged episode of chest pain. ECG revealed elevations in the precordial leads. He underwent urgent cardiac catheterization which revealed proximal LAD. The patient underwent primary PCI, receiving overlapping drug-eluting stents in the proximal LAD with resolution of chest pain. The patient ruled in for myocardial infarction with a troponin greater than 65.00. This morning, the patient has some mild right-sided chest discomfort which is positional nature.  Review of systems complete and found to be negative unless listed above     Past Medical History:  Diagnosis Date  . Alcohol abuse   . Pre-diabetes   . Tobacco use     Past Surgical History:  Procedure Laterality Date  . ANTERIOR CRUCIATE LIGAMENT REPAIR  [phone]   right  . APPENDECTOMY  1998  . CORONARY STENT INTERVENTION N/A 10/30/2016   Procedure: Coronary Stent Intervention;  Surgeon: Marcina Millard, MD;  Location: [facility] INVASIVE CV LAB;  Service: Cardiovascular;  Laterality: N/A;  . LEFT HEART CATH AND CORONARY ANGIOGRAPHY N/A 10/30/2016   Procedure: Left Heart Cath and Coronary Angiography;  Surgeon: Marcina Millard, MD;  Location: [facility] INVASIVE CV LAB;  Service: Cardiovascular;  Laterality: N/A;    Prescriptions Prior to Admission  Medication Sig Dispense Refill Last Dose  . acetaminophen (TYLENOL) 500 MG tablet Take 500 mg by mouth every 6 (six) hours as needed.   prn at prn  . aspirin 325  MG tablet Take 325 mg by mouth every 4 (four) hours as needed.   10/29/2016 at prn  . naproxen sodium (ANAPROX) 220 MG tablet Take 220 mg by mouth 2 (two) times daily with a meal.   prn at prn  . diazepam (VALIUM) 2 MG tablet Take 1 tablet (2 mg total) by mouth 3 (three) times daily. (Patient not taking: Reported on 10/30/2016) 6 tablet 0 Not Taking at Unknown time  . meloxicam (MOBIC) 15 MG tablet Take 1 tablet (15 mg total) by mouth daily. (Patient not taking: Reported on 10/30/2016) 30 tablet 0 Completed Course at Unknown time  . metaxalone (SKELAXIN) 800 MG tablet Take 1 tablet (800 mg total) by mouth 3 (three) times daily. Start after finishing Valium for muscle spasm (Patient not taking: Reported on 10/30/2016) 21 tablet 0 Completed Course at Unknown time   Social History   Social History  . Marital status: Single    Spouse name: N/A  . Number of children: N/A  . Years of education: N/A   Occupational History  . Not on file.   Social History Main Topics  . Smoking status: Current Every Day Smoker  . Smokeless tobacco: Former Neurosurgeon     Comment: 1/2 ppd  . Alcohol use No  . Drug use: No  . Sexual activity: Not on file   Other Topics Concern  . Not on file   Social History Narrative  . No narrative on file    Family History  Problem Relation Age of Onset  . Arthritis Father   . Diabetes Father   . Hypertension Mother   . Prostate cancer Paternal Uncle   .  Breast cancer Neg Hx   . Heart disease Neg Hx   . Colon cancer Neg Hx       Review of systems complete and found to be negative unless listed above      PHYSICAL EXAM  General: Well developed, well nourished, in no acute distress HEENT:  Normocephalic and atramatic Neck:  No JVD.  Lungs: Clear bilaterally to auscultation and percussion. Heart: HRRR . Normal S1 and S2 without gallops or murmurs.  Abdomen: Bowel sounds are positive, abdomen soft and non-tender  Msk:  Back normal, normal gait. Normal strength and  tone for age. Extremities: No clubbing, cyanosis or edema.   Neuro: Alert and oriented X 3. Psych:  Good affect, responds appropriately  Labs:   Lab Results  Component Value Date   WBC 12.3 (H) 10/31/2016   HGB 14.6 10/31/2016   HCT 42.1 10/31/2016   MCV 85.2 10/31/2016   PLT 201 10/31/2016    Recent Labs Lab 10/30/16 1610 10/31/16 0407  NA 138 139  K 4.1 3.9  CL 106 110  CO2 26 22  BUN 20 19  CREATININE 1.48* 0.88  CALCIUM 9.1 8.2*  PROT 7.2  --   BILITOT 0.5  --   ALKPHOS 138*  --   ALT 31  --   AST 23  --   GLUCOSE 158* 144*   Lab Results  Component Value Date   TROPONINI >65.00 (HH) 10/31/2016    Lab Results  Component Value Date   CHOL 160 10/31/2016   CHOL 202 (H) 10/30/2016   Lab Results  Component Value Date   HDL 30 (L) 10/31/2016   HDL 37 (L) 10/30/2016   Lab Results  Component Value Date   LDLCALC 103 (H) 10/31/2016   LDLCALC 110 (H) 10/30/2016   Lab Results  Component Value Date   TRIG 136 10/31/2016   TRIG 276 (H) 10/30/2016   Lab Results  Component Value Date   CHOLHDL 5.3 10/31/2016   CHOLHDL 5.5 10/30/2016   No results found for: LDLDIRECT    Radiology: Dg Chest Port 1 View  Result Date: 10/30/2016 CLINICAL DATA:  STEMI in progress with chest pain and dyspnea. Concern for dissection. EXAM: PORTABLE CHEST 1 VIEW COMPARISON:  None. FINDINGS: The heart size and mediastinal contours are within normal limits. No mediastinal widening. External defibrillator paddles project over the left hemithorax and cardiac silhouette. No pneumothorax or pneumonic consolidation. Slight central vascular congestion. The visualized skeletal structures are unremarkable. IMPRESSION: No mediastinal widening. Slight vascular congestion. No acute pulmonary disease. Electronically Signed   By: Tollie Ethavid  Kwon M.D.   On: 10/30/2016 16:30    EKG: Anterior ST elevations in precordial leads  ASSESSMENT AND PLAN:   1. Anterior STEMI, status post primary PCI, with DES  proximal LAD  Recommendations  1. Continue current therapy 2. Continue Aggrastat for total of 18 hours 3. Continue dual antiplatelet therapy uninterrupted for 1 year 4. Add lisinopril 5 mg daily 5. 2-D echocardiogram 6. May transfer to telemetry later today  Signed: Marcina MillardAlexander Demeshia Sherburne MD,PhD, [facility] Surgery CenterFACC 10/31/2016, 8:01 AM

## 2016-10-31 NOTE — Progress Notes (Signed)
CH responded to a Code Blue in IC-03. Medical team was attending to Pt. CH provided ministerial presence and empathetic listening to friend in the IC waiting area. Pt was transported to Cath Lab and CH accompanied friend to special need waiting room. CH informed Cath Lab of her presence. CH will continue to monitor and is available as needed.    10/31/16 1500  Clinical Encounter Type  Visited With Patient;Patient and family together;Health care provider  Visit Type Follow-up;Spiritual support;Code;Critical Care  Referral From Nurse  Consult/Referral To Chaplain  Spiritual Encounters  Spiritual Needs Prayer;Emotional

## 2016-10-31 NOTE — Progress Notes (Signed)
Pt still complaining of pain level of 3/10.  Aching, dull pain in chest, but is able to sleep intermittently. Pt remains on aggrastat infusion.  Pt reported that he does have sleep apnea, but does not wear oxygen at home.  Placed pt on 2L nasal cannula to assist pt with breathing while sleeping.  Second troponin level reported at >65.  Per NP, get repeat troponin and will reassess.

## 2016-10-31 NOTE — Progress Notes (Signed)
PAD removed, no hematoma, no bleeding noted.

## 2016-10-31 NOTE — Progress Notes (Signed)
Pt. Was suctioned prior o extubation for a small amount of thick secretions. He was extubated without incident. He was placed on 2 L nasal cannula. He is tlking and without stridor.

## 2016-10-31 NOTE — Progress Notes (Signed)
eLink Physician-Brief Progress Note Patient Name: William GripSteven J Bates DOB: 07-May-1966 MRN: [id number]   Date of Service  10/31/2016  HPI/Events of Note  Camera check on patient postextubation. Resting comfortably in bed. Watching TV. No acute distress.   eICU Interventions  Continuing close ICU monitoring.      Intervention Category Major Interventions: Respiratory failure - evaluation and management  Lawanda CousinsJennings Kevionna Heffler 10/31/2016, 9:53 PM

## 2016-10-31 NOTE — Progress Notes (Signed)
Pt. Was transported to the cath lab while on the vent and back to CCU.

## 2016-10-31 NOTE — Care Management (Addendum)
Code Blue called on this patient prior to [facility] visit; patient was on ventilator. Patient awoke during my visit and NP will try to extubate today. RNCM left Brilinta coupon, Medication Mgt[facility] Application (address is in [place]), [place] list, and [employer] health financial assistance contact at bedside- along with this RNCM's contact card. RNCM will continue to follow. Patient is listed as self-pay. Since this note, I met with patient and his girlfriend. Patient has Holland Falling which now shows in patient's record. Brilinta coupon for patient's with heath insurance provided to patient. Patient does not have a PCP and wants follow up Dr. Ramonita Lab at [facility]. I have left message for Dr. Olin Pia nurse to see if Dr. Caryl Comes can accept.

## 2016-11-01 ENCOUNTER — Inpatient Hospital Stay: Payer: 59

## 2016-11-01 LAB — BASIC METABOLIC PANEL
Anion gap: 5 (ref 5–15)
BUN: 14 mg/dL (ref 6–20)
CO2: 25 mmol/L (ref 22–32)
Calcium: 8.5 mg/dL — ABNORMAL LOW (ref 8.9–10.3)
Chloride: 109 mmol/L (ref 101–111)
Creatinine, Ser: 0.99 mg/dL (ref 0.61–1.24)
GFR calc Af Amer: >60 mL/min (ref >60)
Glucose, Bld: 127 mg/dL — ABNORMAL HIGH (ref 65–99)
POTASSIUM: 4.2 mmol/L (ref 3.5–5.1)
Sodium: 139 mmol/L (ref 135–145)

## 2016-11-01 LAB — CBC
HCT: 38.4 % — ABNORMAL LOW (ref 40.0–52.0)
Hemoglobin: 13.4 g/dL (ref 13.0–18.0)
MCH: 29.8 pg (ref 26.0–34.0)
MCHC: 34.8 g/dL (ref 32.0–36.0)
MCV: 85.5 fL (ref 80.0–100.0)
PLATELETS: 177 10*3/uL (ref 150–440)
RBC: 4.49 MIL/uL (ref 4.40–5.90)
RDW: 13.8 % (ref 11.5–14.5)
WBC: 12.9 10*3/uL — ABNORMAL HIGH (ref 3.8–10.6)

## 2016-11-01 LAB — HEMOGLOBIN A1C
Hgb A1c MFr Bld: 6 % — ABNORMAL HIGH (ref 4.8–5.6)
Mean Plasma Glucose: 126 mg/dL

## 2016-11-01 LAB — MAGNESIUM: Magnesium: 1.7 mg/dL (ref 1.7–2.4)

## 2016-11-01 MED ORDER — MAGNESIUM SULFATE 2 GM/50ML IV SOLN
2.0000 g | Freq: Once | INTRAVENOUS | Status: AC
  Administered 2016-11-01: 2 g via INTRAVENOUS
  Filled 2016-11-01: qty 50

## 2016-11-01 MED ORDER — METOPROLOL TARTRATE 25 MG PO TABS
12.5000 mg | ORAL_TABLET | Freq: Two times a day (BID) | ORAL | Status: DC
  Administered 2016-11-01: 12.5 mg via ORAL
  Filled 2016-11-01: qty 1

## 2016-11-01 MED ORDER — TICAGRELOR 90 MG PO TABS
90.0000 mg | ORAL_TABLET | Freq: Two times a day (BID) | ORAL | Status: DC
  Administered 2016-11-01 – 2016-11-04 (×8): 90 mg via ORAL
  Filled 2016-11-01 (×8): qty 1

## 2016-11-01 NOTE — Progress Notes (Signed)
Name: William Bates MRN: [id number] DOB: [birth year]    ADMISSION DATE:  10/30/2016  CHIEF COMPLAINT: "Crushing Chest pain"  BRIEF PATIENT DESCRIPTION:  51 year old male with STEMI S/P PCI to LAD. Vfibb arrest requiring mechanical intubation briefly 05/30 post cardiac arrest pt extubated 05/30  SIGNIFICANT EVENTS  5/29-Patient admitted to the ICU with STEMI S/P PCI to LAD 05/30-Pt vfibb arrest requiring mechanical intubation briefly then extubated   STUDIES:  5/29 ECHO>>EF 35% to 40% 05/29 Cardiac Catheterization>>One-vessel CAD with occluded proximal LAD Mildly reduced left ventricular function with LVEF of 41% with anteroapical hypokinesis. Successful primary PCI with DES proximal LAD 05/30 Cardiac Catheterization>>Insignificant coronary artery disease, with patent stents proximal LAD and 60% stenosis OM 2  REVIEW OF SYSTEMS:  Positives in BOLD  Constitutional: Negative for fever, chills, weight loss, malaise/fatigue and diaphoresis.  HENT: Negative for hearing loss, ear pain, nosebleeds, congestion, sore throat, neck pain, tinnitus and ear discharge.   Eyes: Negative for blurred vision, double vision, photophobia, pain, discharge and redness.  Respiratory: Negative for cough, hemoptysis, sputum production, shortness of breath, wheezing and stridor.   Cardiovascular: Negative for chest pain, palpitations, orthopnea, claudication, leg swelling and PND.  Gastrointestinal: Negative for heartburn, nausea, vomiting, abdominal pain, diarrhea, constipation, blood in stool and melena.  Genitourinary: Negative for dysuria, urgency, frequency, hematuria and flank pain.  Musculoskeletal: Negative for myalgias, back pain, joint pain and falls.  Skin: Negative for itching and rash.  Neurological: Negative for dizziness, tingling, tremors, sensory change, speech change, focal weakness, seizures, loss of consciousness, weakness and headaches.  Endo/Heme/Allergies: Negative for environmental  allergies and polydipsia. Does not bruise/bleed easily.  SUBJECTIVE: Pt states he has occasional chest pain when coughing no other complaints at this time.  VITAL SIGNS: Temp:  [97.9 F (36.6 C)-98.7 F (37.1 C)] 98.5 F (36.9 C) (05/31 0842) Pulse Rate:  [51-67] 67 (05/31 0843) Resp:  [15-31] 20 (05/31 0700) BP: (76-110)/(55-85) 91/62 (05/31 0844) SpO2:  [96 %-100 %] 99 % (05/31 0700) FiO2 (%):  [40 %] 40 % (05/30 1200)  PHYSICAL EXAMINATION: General: Middle-aged, well-built, well-nourished , in no acute distress Neuro:  Awake, alert, oriented HEENT:  Atraumatic, normocephalic, no JVD Cardiovascular:  S1 and S2, regular, no m/r/g noted Lungs:  Clear bilaterally, no wheezes, crackles, rhonchi noted Abdomen: soft, Non tender,+BS Musculoskeletal:  No edema, cyanosis noted Skin:  Warm, dry and intact   Recent Labs Lab 10/30/16 1610 10/31/16 0407 11/01/16 0410  NA 138 139 139  K 4.1 3.9 4.2  CL 106 110 109  CO2 26 22 25   BUN 20 19 14   CREATININE 1.48* 0.88 0.99  GLUCOSE 158* 144* 127*    Recent Labs Lab 10/30/16 1610 10/31/16 0407 11/01/16 0410  HGB 15.5 14.6 13.4  HCT 45.8 42.1 38.4*  WBC 12.6* 12.3* 12.9*  PLT 208 201 177   Dg Chest 1 View  Result Date: 11/01/2016 CLINICAL DATA:  Shortness of breath. EXAM: CHEST 1 VIEW COMPARISON:  10/30/2016 . FINDINGS: Mediastinum hilar structures are stable. Stable cardiomegaly. Developing atelectasis and infiltrate right lung base. Small right pleural effusion. IMPRESSION: 1. Developing atelectasis and infiltrate right lung base. Small right pleural effusion. 2. Stable cardiomegaly. Electronically Signed   By: Maisie Fus  Register   On: 11/01/2016 06:17   Dg Chest Port 1 View  Result Date: 10/30/2016 CLINICAL DATA:  STEMI in progress with chest pain and dyspnea. Concern for dissection. EXAM: PORTABLE CHEST 1 VIEW COMPARISON:  None. FINDINGS: The heart size and mediastinal  contours are within normal limits. No mediastinal  widening. External defibrillator paddles project over the left hemithorax and cardiac silhouette. No pneumothorax or pneumonic consolidation. Slight central vascular congestion. The visualized skeletal structures are unremarkable. IMPRESSION: No mediastinal widening. Slight vascular congestion. No acute pulmonary disease. Electronically Signed   By: Tollie Ethavid  Kwon M.D.   On: 10/30/2016 16:30   Dg Abd Portable 1v  Result Date: 10/31/2016 CLINICAL DATA:  Orogastric tube placement. EXAM: PORTABLE ABDOMEN - 1 VIEW COMPARISON:  10/18/2015. FINDINGS: NG tube noted with tip projected over superior vena cava. Degenerative changes lumbar spine. No acute or focal bony abnormality identified. Bibasilar subsegmental atelectasis and/or infiltrates. IMPRESSION: 1. NG tube noted with its tip projected over superior vena cava. 2.  Right bibasilar subsegmental atelectasis and/or infiltrates. Electronically Signed   By: Maisie Fushomas  Register   On: 10/31/2016 12:29    ASSESSMENT / PLAN: STEMI s/p PCI to LAD Tobacco abuse Hx: Heavy Cocaine Abuse  P: Supplemental O2 to maintain O2 sats >92% Continue aspirin, atorvastatin, lisinopril, metoprolol, and brilinta  Continuous telemetry monitoring  Cardiology and Electrophysiology consulted appreciate input  Per cardiology recommendations continue dual antiplatelet therapy uninterrupted for 1 year Will attempt to wean amiodarone gtt off  Per Cardiology will likely require LifeVest prior to discharge If able to wean amiodarone gtt off will transfer to telemetry unit today and PCCM team will sign off   Sonda Rumbleana Blakeney, Pioneers Medical CenterGNP  Pulmonary/Critical Care Pager [phone] (please enter 7 digits) PCCM Consult Pager [phone] (please enter 7 digits)  Patient seen and examined with NP, agree with findings, assessment, plan. He is awake and alert, complains of chest and back pain. Lungs are clear to auscultation. Patient has a history of tobacco abuse, presented with an STEMI with PCI  to LAD. This, complicated by recurrent cardiac arrest, ventricular fibrillation arrest, shocked 2. Patient was returned to Cath Lab with this incident, 8 and stents were noted. He is noted to have ischemic cardiomyopathy with the ejection fraction of 35%. We'll continue current anticoagulation per cardiology recommendations, continue amiodarone.  Wells Guiles-Deep Kristofer Schaffert, M.D. 11/01/2016

## 2016-11-01 NOTE — Progress Notes (Addendum)
[facility]  SUBJECTIVE: I don't have chest pain   Vitals:   11/01/16 0300 11/01/16 0400 11/01/16 0500 11/01/16 0600  BP: 90/68 90/66 94/64  93/60  Pulse: (!) 54 (!) 52 67 60  Resp: (!) 23 (!) 24 (!) 25 15  Temp: 98.5 F (36.9 C)     TempSrc: Oral     SpO2: 99% 100% 97% 96%  Weight:      Height:         Intake/Output Summary (Last 24 hours) at 11/01/16 [id number] Last data filed at 11/01/16 0456  Gross per 24 hour  Intake          3725.12 ml  Output             1605 ml  Net          2120.12 ml      PHYSICAL EXAM  General: Well developed, well nourished, in no acute distress HEENT:  Normocephalic and atramatic Neck:  No JVD.  Lungs: Clear bilaterally to auscultation and percussion. Heart: HRRR . Normal S1 and S2 without gallops or murmurs.  Abdomen: Bowel sounds are positive, abdomen soft and non-tender  Msk:  Back normal, normal gait. Normal strength and tone for age. Extremities: No clubbing, cyanosis or edema.   Neuro: Alert and oriented X 3. Psych:  Good affect, responds appropriately   LABS: Basic Metabolic Panel:  Recent Labs  82/95/6205/30/18 0407 11/01/16 0410  NA 139 139  K 3.9 4.2  CL 110 109  CO2 22 25  GLUCOSE 144* 127*  BUN 19 14  CREATININE 0.88 0.99  CALCIUM 8.2* 8.5*  MG  --  1.7   Liver Function Tests:  Recent Labs  10/30/16 1610  AST 23  ALT 31  ALKPHOS 138*  BILITOT 0.5  PROT 7.2  ALBUMIN 4.7   No results for input(s): LIPASE, AMYLASE in the last 72 hours. CBC:  Recent Labs  10/30/16 1610 10/31/16 0407 11/01/16 0410  WBC 12.6* 12.3* 12.9*  NEUTROABS 6.2  --   --   HGB 15.5 14.6 13.4  HCT 45.8 42.1 38.4*  MCV 85.4 85.2 85.5  PLT 208 201 177   Cardiac Enzymes:  Recent Labs  10/30/16 2333 10/31/16 0052 10/31/16 0407  TROPONINI >65.00* >65.00* >65.00*   BNP: Invalid input(s): POCBNP D-Dimer: No results for input(s): DDIMER in the last 72 hours. Hemoglobin A1C:  Recent Labs  10/30/16 1610  HGBA1C 6.0*   Fasting  Lipid Panel:  Recent Labs  10/31/16 0407 10/31/16 1125  CHOL 160  --   HDL 30*  --   LDLCALC 103*  --   TRIG 136 136  CHOLHDL 5.3  --    Thyroid Function Tests: No results for input(s): TSH, T4TOTAL, T3FREE, THYROIDAB in the last 72 hours.  Invalid input(s): FREET3 Anemia Panel: No results for input(s): VITAMINB12, FOLATE, FERRITIN, TIBC, IRON, RETICCTPCT in the last 72 hours.  Dg Chest 1 View  Result Date: 11/01/2016 CLINICAL DATA:  Shortness of breath. EXAM: CHEST 1 VIEW COMPARISON:  10/30/2016 . FINDINGS: Mediastinum hilar structures are stable. Stable cardiomegaly. Developing atelectasis and infiltrate right lung base. Small right pleural effusion. IMPRESSION: 1. Developing atelectasis and infiltrate right lung base. Small right pleural effusion. 2. Stable cardiomegaly. Electronically Signed   By: Maisie Fushomas  Register   On: 11/01/2016 06:17   Dg Chest Port 1 View  Result Date: 10/30/2016 CLINICAL DATA:  STEMI in progress with chest pain and dyspnea. Concern for dissection. EXAM: PORTABLE CHEST 1 VIEW COMPARISON:  None. FINDINGS: The heart size and mediastinal contours are within normal limits. No mediastinal widening. External defibrillator paddles project over the left hemithorax and cardiac silhouette. No pneumothorax or pneumonic consolidation. Slight central vascular congestion. The visualized skeletal structures are unremarkable. IMPRESSION: No mediastinal widening. Slight vascular congestion. No acute pulmonary disease. Electronically Signed   By: Tollie Eth M.D.   On: 10/30/2016 16:30   Dg Abd Portable 1v  Result Date: 10/31/2016 CLINICAL DATA:  Orogastric tube placement. EXAM: PORTABLE ABDOMEN - 1 VIEW COMPARISON:  10/18/2015. FINDINGS: NG tube noted with tip projected over superior vena cava. Degenerative changes lumbar spine. No acute or focal bony abnormality identified. Bibasilar subsegmental atelectasis and/or infiltrates. IMPRESSION: 1. NG tube noted with its tip projected  over superior vena cava. 2.  Right bibasilar subsegmental atelectasis and/or infiltrates. Electronically Signed   By: Maisie Fus  Register   On: 10/31/2016 12:29     Echo LV EF 35-40% with anterior apical hypokinesis  TELEMETRY: Sinus rhythm:  ASSESSMENT AND PLAN:  Active Problems:   STEMI (ST elevation myocardial infarction) (HCC)   Chest pain   Cardiac arrest, cause unspecified (HCC)    1. Anterior STEMI, status post primary PCI with DES proximal LAD 2. V. fib arrest, rule out cardiac catheterization revealed patent stent proximal LAD 3. Ischemic cardiomyopathy, LVEF 35-40%  Recommendations  1. Continue dual antiplatelet therapy uninterrupted for 1 year 2. DC Aggrastat 3. Continue amiodarone drip for now 4. EP consult 5. May transfer patient to telemetry later today 6. Patient likely will require LifeVest prior to discharge   Marcina Millard, MD, PhD, [facility] 11/01/2016 8:22 AM

## 2016-11-01 NOTE — Care Management (Signed)
Notified by Sharyl NimrodMeredith with [facility] that Cards has notified her of potential LifeVest need- this is in Cards note today (11/01/16). Please let covering RNCM know if this is needed prior to discharge.

## 2016-11-01 NOTE — Consult Note (Signed)
ELECTROPHYSIOLOGY CONSULT NOTE  Patient ID: William Bates, MRN: [id number], DOB/AGE: [birth year] 51 y.o. Admit date: 10/30/2016 Date of Consult: 11/01/2016  Primary Physician: Edwyna Perfect, MD (Inactive) Primary Cardiologist:     Chief Complaint: VF less than 48 hrs post Mi   HPI William Bates is a 51 y.o. male  With VR 16 hrs post MI with LAD stenting Rhythm occurs without warning.  9 min of VF prior to shock   He had been admitted with acute anterior wall MI with a three-week history of antecedent exertional chest discomfort. He has no history of prior arrhythmias.  Echo LVEF 35-40%  Recath patent stents    Past Medical History:  Diagnosis Date  . Alcohol abuse   . Pre-diabetes   . Tobacco use       Surgical History:  Past Surgical History:  Procedure Laterality Date  . ANTERIOR CRUCIATE LIGAMENT REPAIR  [phone]   right  . APPENDECTOMY  1998  . CORONARY STENT INTERVENTION N/A 10/30/2016   Procedure: Coronary Stent Intervention;  Surgeon: Marcina Millard, MD;  Location: [facility] INVASIVE CV LAB;  Service: Cardiovascular;  Laterality: N/A;  . CORONARY STENT INTERVENTION N/A 10/31/2016   Procedure: Coronary Stent Intervention;  Surgeon: Marcina Millard, MD;  Location: [facility] INVASIVE CV LAB;  Service: Cardiovascular;  Laterality: N/A;  . LEFT HEART CATH AND CORONARY ANGIOGRAPHY N/A 10/30/2016   Procedure: Left Heart Cath and Coronary Angiography;  Surgeon: Marcina Millard, MD;  Location: [facility] INVASIVE CV LAB;  Service: Cardiovascular;  Laterality: N/A;  . LEFT HEART CATH AND CORONARY ANGIOGRAPHY N/A 10/31/2016   Procedure: Left Heart Cath and Coronary Angiography;  Surgeon: Marcina Millard, MD;  Location: [facility] INVASIVE CV LAB;  Service: Cardiovascular;  Laterality: N/A;     Home Meds: Prior to Admission medications   Medication Sig Start Date End Date Taking? Authorizing Provider  acetaminophen (TYLENOL) 500 MG tablet Take 500 mg by mouth every  6 (six) hours as needed.   Yes [provider]  aspirin 325 MG tablet Take 325 mg by mouth every 4 (four) hours as needed.   Yes [provider]  naproxen sodium (ANAPROX) 220 MG tablet Take 220 mg by mouth 2 (two) times daily with a meal.   Yes [provider]  diazepam (VALIUM) 2 MG tablet Take 1 tablet (2 mg total) by mouth 3 (three) times daily. Patient not taking: Reported on 10/30/2016 10/18/15   Lutricia Feil, PA-C  meloxicam (MOBIC) 15 MG tablet Take 1 tablet (15 mg total) by mouth daily. Patient not taking: Reported on 10/30/2016 10/18/15   Lutricia Feil, PA-C  metaxalone (SKELAXIN) 800 MG tablet Take 1 tablet (800 mg total) by mouth 3 (three) times daily. Start after finishing Valium for muscle spasm Patient not taking: Reported on 10/30/2016 10/18/15   Lutricia Feil, PA-C    Inpatient Medications:  . aspirin  324 mg Oral Once  . aspirin  81 mg Oral Daily  . atorvastatin  40 mg Oral q1800  . heparin  4,000 Units Intravenous Once  . lisinopril  5 mg Oral Daily  . metoprolol tartrate  25 mg Oral BID  . sodium chloride flush  3 mL Intravenous Q12H  . sodium chloride flush  3 mL Intravenous Q12H  . ticagrelor  90 mg Oral BID     Allergies: No Known Allergies  Social History   Social History  . Marital status: Single    Spouse name: N/A  .  Number of children: N/A  . Years of education: N/A   Occupational History  . Not on file.   Social History Main Topics  . Smoking status: Current Every Day Smoker  . Smokeless tobacco: Former NeurosurgeonUser     Comment: 1/2 ppd  . Alcohol use No  . Drug use: No  . Sexual activity: Not on file   Other Topics Concern  . Not on file   Social History Narrative  . No narrative on file     Family History  Problem Relation Age of Onset  . Arthritis Father   . Diabetes Father   . Hypertension Mother   . Prostate cancer Paternal Uncle   . Breast cancer Neg Hx   . Heart disease Neg Hx   . Colon cancer Neg  Hx      ROS:  Please see the history of present illness.     All other systems reviewed and negative.    Physical Exam:  Blood pressure 91/62, pulse 67, temperature 98.5 F (36.9 C), temperature source Oral, resp. rate 20, height 6\' 1"  (1.854 m), weight 256 lb 2.8 oz (116.2 kg), SpO2 99 %. General: Well developed, well nourished male in no acute distress. Head: Normocephalic, atraumatic, sclera non-icteric, no xanthomas, nares are without discharge. EENT: normal Lymph Nodes:  none Back: without scoliosis/kyphosis , no CVA tendersness Neck: Negative for carotid bruits. JVD not elevated. Lungs: Clear bilaterally to auscultation without wheezes, rales, or rhonchi. Breathing is unlabored. Heart: RRR with S1 S2. No  murmur , rubs, or gallops appreciated. Abdomen: Soft, non-tender, non-distended with normoactive bowel sounds. No hepatomegaly. No rebound/guarding. No obvious abdominal masses. Msk:  Strength and tone appear normal for age. Extremities: No clubbing or cyanosis. No edema.  Distal pedal pulses are 2+ and equal bilaterally. Skin: Warm and Dry Neuro: Alert and oriented X 3. CN III-XII intact Grossly normal sensory and motor function . Psych:  Responds to questions appropriately with a normal affect.      Labs: Cardiac Enzymes  Recent Labs  10/30/16 1610 10/30/16 2333 10/31/16 0052 10/31/16 0407  TROPONINI 0.08* >65.00* >65.00* >65.00*   CBC Lab Results  Component Value Date   WBC 12.9 (H) 11/01/2016   HGB 13.4 11/01/2016   HCT 38.4 (L) 11/01/2016   MCV 85.5 11/01/2016   PLT 177 11/01/2016   PROTIME:  Recent Labs  10/30/16 1923  LABPROT 14.2  INR 1.10   Chemistry  Recent Labs Lab 10/30/16 1610  11/01/16 0410  NA 138  < > 139  K 4.1  < > 4.2  CL 106  < > 109  CO2 26  < > 25  BUN 20  < > 14  CREATININE 1.48*  < > 0.99  CALCIUM 9.1  < > 8.5*  PROT 7.2  --   --   BILITOT 0.5  --   --   ALKPHOS 138*  --   --   ALT 31  --   --   AST 23  --   --     GLUCOSE 158*  < > 127*  < > = values in this interval not displayed. Lipids Lab Results  Component Value Date   CHOL 160 10/31/2016   HDL 30 (L) 10/31/2016   LDLCALC 103 (H) 10/31/2016   TRIG 136 10/31/2016   BNP No results found for: PROBNP Thyroid Function Tests: No results for input(s): TSH, T4TOTAL, T3FREE, THYROIDAB in the last 72 hours.  Invalid input(s): FREET3  Miscellaneous No results found for: DDIMER  Radiology/Studies:  Dg Chest 1 View  Result Date: 11/01/2016 CLINICAL DATA:  Shortness of breath. EXAM: CHEST 1 VIEW COMPARISON:  10/30/2016 . FINDINGS: Mediastinum hilar structures are stable. Stable cardiomegaly. Developing atelectasis and infiltrate right lung base. Small right pleural effusion. IMPRESSION: 1. Developing atelectasis and infiltrate right lung base. Small right pleural effusion. 2. Stable cardiomegaly. Electronically Signed   By: Maisie Fus  Register   On: 11/01/2016 06:17   Dg Chest Port 1 View  Result Date: 10/30/2016 CLINICAL DATA:  STEMI in progress with chest pain and dyspnea. Concern for dissection. EXAM: PORTABLE CHEST 1 VIEW COMPARISON:  None. FINDINGS: The heart size and mediastinal contours are within normal limits. No mediastinal widening. External defibrillator paddles project over the left hemithorax and cardiac silhouette. No pneumothorax or pneumonic consolidation. Slight central vascular congestion. The visualized skeletal structures are unremarkable. IMPRESSION: No mediastinal widening. Slight vascular congestion. No acute pulmonary disease. Electronically Signed   By: Tollie Eth M.D.   On: 10/30/2016 16:30   Dg Abd Portable 1v  Result Date: 10/31/2016 CLINICAL DATA:  Orogastric tube placement. EXAM: PORTABLE ABDOMEN - 1 VIEW COMPARISON:  10/18/2015. FINDINGS: NG tube noted with tip projected over superior vena cava. Degenerative changes lumbar spine. No acute or focal bony abnormality identified. Bibasilar subsegmental atelectasis and/or  infiltrates. IMPRESSION: 1. NG tube noted with its tip projected over superior vena cava. 2.  Right bibasilar subsegmental atelectasis and/or infiltrates. Electronically Signed   By: Maisie Fus  Register   On: 10/31/2016 12:29       Telemetry Personally reviewed   Abrupt onset VF/PMVT at 6 hrs post MI   Assessment and Plan:  Ventricular fibrillation less than 48 hours following STEMI  Anterior wall MI  Ischemic cardiomyopathy  The patient has ventricular fibrillation acutely after STEMI. There is no indication for ICD at this point I would discontinue the amiodarone and anticipate discharge. I think it is reasonable to discharge with a LifeVest. W/ EF> 35% also no role for interval risk stratification with EPS  At 3 month, LV reassessment and if less thatn 35% would be glad to see for consideration of an ICD Sherryl Manges

## 2016-11-01 NOTE — Progress Notes (Signed)
While rounding, CH made a follow up visit. Pt is awake and appeared restless. Girl friend is bedside. Pt stated he is feeling better and is restless being confined to the bed with all the IV's etc. Friend is very appreciative of the care, medical and spiritual, provided on their behalf. CH is available for follow up as needed.    11/01/16 1100  Clinical Encounter Type  Visited With Patient;Patient and family together  Visit Type Follow-up;Spiritual support  Consult/Referral To Chaplain

## 2016-11-01 NOTE — Progress Notes (Signed)
[employer] [facility] [place] [facility]   PATIENT NAME: Eusevio Schriver    MR#:  [id number]  DATE OF BIRTH:  12/17/1965  SUBJECTIVE:  CHIEF COMPLAINT:   Chief Complaint  Patient presents with  . Code STEMI   Came with STEMI- s/p stent 10/30/16- had seizures and v tech- code blue activated in 10/31/16 morning , intubated and started on amiodarone drip. Extubated 10/31/16. Completely alert and oriented now.  REVIEW OF SYSTEMS:     Review of Systems  Constitutional: Negative for fever, malaise/fatigue and weight loss.  HENT: Negative for congestion, hearing loss and sore throat.   Eyes: Negative for blurred vision and double vision.  Respiratory: Positive for cough. Negative for hemoptysis and shortness of breath.   Cardiovascular: Negative for chest pain, palpitations, orthopnea and leg swelling.  Gastrointestinal: Negative for constipation, heartburn, nausea and vomiting.  Genitourinary: Negative for dysuria and frequency.  Musculoskeletal: Negative for joint pain and myalgias.  Neurological: Negative for dizziness, tremors, focal weakness and headaches.  Psychiatric/Behavioral: Negative for depression and hallucinations.    DRUG ALLERGIES:  No Known Allergies  VITALS:  Blood pressure 108/76, pulse 69, temperature 98.7 F (37.1 C), temperature source Oral, resp. rate 18, height 6\' 1"  (1.854 m), weight 116.2 kg (256 lb 2.8 oz), SpO2 98 %.  PHYSICAL EXAMINATION:  GENERAL:  51 y.o.-year-old patient lying in the bed with no acute distress.  EYES: Pupils equal, round, reactive to light . No scleral icterus. Extraocular muscles intact.  HEENT: Head atraumatic, normocephalic. Oropharynx and nasopharynx clear.  NECK:  Supple, no jugular venous distention. No thyroid enlargement, no tenderness.  LUNGS: Normal breath sounds bilaterally, no wheezing, rales,rhonchi or crepitation. No use of accessory muscles of respiration.  CARDIOVASCULAR: S1, S2 normal. No murmurs, rubs, or  gallops.  ABDOMEN: Soft, nontender, nondistended. Bowel sounds present. No organomegaly or mass.  EXTREMITIES: No pedal edema, cyanosis, or clubbing.  NEUROLOGIC: cranial nerves intact, power 5/5 all 4 limbs, sensation intact, gait not checked. PSYCHIATRIC: The patient is alert and oriented. SKIN: No obvious rash, lesion, or ulcer.   Physical Exam LABORATORY PANEL:   CBC  Recent Labs Lab 11/01/16 0410  WBC 12.9*  HGB 13.4  HCT 38.4*  PLT 177   ------------------------------------------------------------------------------------------------------------------  Chemistries   Recent Labs Lab 10/30/16 1610  11/01/16 0410  NA 138  < > 139  K 4.1  < > 4.2  CL 106  < > 109  CO2 26  < > 25  GLUCOSE 158*  < > 127*  BUN 20  < > 14  CREATININE 1.48*  < > 0.99  CALCIUM 9.1  < > 8.5*  MG  --   --  1.7  AST 23  --   --   ALT 31  --   --   ALKPHOS 138*  --   --   BILITOT 0.5  --   --   < > = values in this interval not displayed. ------------------------------------------------------------------------------------------------------------------  Cardiac Enzymes  Recent Labs Lab 10/31/16 0052 10/31/16 0407  TROPONINI >65.00* >65.00*   ------------------------------------------------------------------------------------------------------------------  RADIOLOGY:  Dg Chest 1 View  Result Date: 11/01/2016 CLINICAL DATA:  Shortness of breath. EXAM: CHEST 1 VIEW COMPARISON:  10/30/2016 . FINDINGS: Mediastinum hilar structures are stable. Stable cardiomegaly. Developing atelectasis and infiltrate right lung base. Small right pleural effusion. IMPRESSION: 1. Developing atelectasis and infiltrate right lung base. Small right pleural effusion. 2. Stable cardiomegaly. Electronically Signed   By: Maisie Fus  Register   On: 11/01/2016  06:17   Dg Chest Port 1 View  Result Date: 10/30/2016 CLINICAL DATA:  STEMI in progress with chest pain and dyspnea. Concern for dissection. EXAM: PORTABLE CHEST  1 VIEW COMPARISON:  None. FINDINGS: The heart size and mediastinal contours are within normal limits. No mediastinal widening. External defibrillator paddles project over the left hemithorax and cardiac silhouette. No pneumothorax or pneumonic consolidation. Slight central vascular congestion. The visualized skeletal structures are unremarkable. IMPRESSION: No mediastinal widening. Slight vascular congestion. No acute pulmonary disease. Electronically Signed   By: Tollie Ethavid  Kwon M.D.   On: 10/30/2016 16:30   Dg Abd Portable 1v  Result Date: 10/31/2016 CLINICAL DATA:  Orogastric tube placement. EXAM: PORTABLE ABDOMEN - 1 VIEW COMPARISON:  10/18/2015. FINDINGS: NG tube noted with tip projected over superior vena cava. Degenerative changes lumbar spine. No acute or focal bony abnormality identified. Bibasilar subsegmental atelectasis and/or infiltrates. IMPRESSION: 1. NG tube noted with its tip projected over superior vena cava. 2.  Right bibasilar subsegmental atelectasis and/or infiltrates. Electronically Signed   By: Maisie Fushomas  Register   On: 10/31/2016 12:29    ASSESSMENT AND PLAN:   Active Problems:   STEMI (ST elevation myocardial infarction) (HCC)   Chest pain   Cardiac arrest, cause unspecified (HCC)   * STEMI   S/p setnt, follow per cardiology.   meds per cardiology. ( antiplatelets and B Blocker)  * v tech- post MI   Taken for cath again on 10/31/16- no new lesions.   Responded to cardioversion and then amio drip.   Manage per cardio.   Seen by EP physician   May need vest on d/c   Cardio suggested to continue amio drip today.  * seizures suspected by ruled out.    Not sure , if seizures really present or not.   ICU physician suggested no meds at this time.   Likely it was due to vent arrhythmia.  * hyperlipidemia   Atorvastatin.  * Hypertension   Lisinopril and metoprolol.    * Ischemic cardiomyopathy    EF is 35-40% per Echo.   Lisinopril for now.    All the records are  reviewed and case discussed with Care Management/Social Workerr. Management plans discussed with the patient, family and they are in agreement.  CODE STATUS: Full.  TOTAL TIME TAKING CARE OF THIS PATIENT: 35 minutes.  Pt's girl friend was present in room.  POSSIBLE D/C IN 1-2 DAYS, DEPENDING ON CLINICAL CONDITION.   Altamese DillingVACHHANI, Caylor Tallarico M.D on 11/01/2016   Between 7am to 6pm - Pager - [phone]  After 6pm go to [email or web address] - password [email or web address]  [facility] [place] [facility]  Office  [phone]  CC: Primary care physician; Patient, No Pcp Per  Note: This dictation was prepared with Dragon dictation along with smaller phrase technology. Any transcriptional errors that result from this process are unintentional.

## 2016-11-01 NOTE — Care Management (Signed)
Patient transferred out of icu s/p stemi with pci.  After pci intervention, suffered cardiac arrest.   Patient has [employer] insurance.  [place] on Brilinta and whether it will require prior authorization. [facility]([employer] Pharmacy management 1 [phone].  does not require prior auth- after patient meets his 5000 dollar out of pocket deductable, patient copay will be 20% of the cost of the medication.  A Life Vest referral is in progress and informed that [facility] can fit it 11/02/2016.  Patient's LV function will be reevaluated in approximately 3 months.

## 2016-11-02 LAB — CBC
HEMATOCRIT: 40.3 % (ref 40.0–52.0)
HEMOGLOBIN: 14.1 g/dL (ref 13.0–18.0)
MCH: 30 pg (ref 26.0–34.0)
MCHC: 34.9 g/dL (ref 32.0–36.0)
MCV: 86.1 fL (ref 80.0–100.0)
Platelets: 157 10*3/uL (ref 150–440)
RBC: 4.68 MIL/uL (ref 4.40–5.90)
RDW: 13.4 % (ref 11.5–14.5)
WBC: 13.8 10*3/uL — AB (ref 3.8–10.6)

## 2016-11-02 LAB — BASIC METABOLIC PANEL
ANION GAP: 5 (ref 5–15)
BUN: 13 mg/dL (ref 6–20)
CO2: 26 mmol/L (ref 22–32)
Calcium: 8.5 mg/dL — ABNORMAL LOW (ref 8.9–10.3)
Chloride: 108 mmol/L (ref 101–111)
Creatinine, Ser: 0.98 mg/dL (ref 0.61–1.24)
GFR calc Af Amer: >60 mL/min (ref >60)
GLUCOSE: 120 mg/dL — AB (ref 65–99)
POTASSIUM: 4 mmol/L (ref 3.5–5.1)
Sodium: 139 mmol/L (ref 135–145)

## 2016-11-02 MED ORDER — ENOXAPARIN SODIUM 40 MG/0.4ML ~~LOC~~ SOLN
40.0000 mg | SUBCUTANEOUS | Status: DC
  Administered 2016-11-02 – 2016-11-03 (×2): 40 mg via SUBCUTANEOUS
  Filled 2016-11-02 (×2): qty 0.4

## 2016-11-02 MED ORDER — SODIUM CHLORIDE 0.9 % IV SOLN
3.0000 g | Freq: Four times a day (QID) | INTRAVENOUS | Status: DC
  Administered 2016-11-02 – 2016-11-04 (×8): 3 g via INTRAVENOUS
  Filled 2016-11-02 (×11): qty 3

## 2016-11-02 MED ORDER — METOPROLOL TARTRATE 25 MG PO TABS
25.0000 mg | ORAL_TABLET | Freq: Two times a day (BID) | ORAL | Status: DC
  Administered 2016-11-02 – 2016-11-03 (×4): 25 mg via ORAL
  Filled 2016-11-02 (×4): qty 1

## 2016-11-02 NOTE — Care Management (Signed)
Faxed additional cardiac note to [facility] per request.  Spoke with cardiology this morning and patient will require to have the life vest prior to discharge.

## 2016-11-02 NOTE — Progress Notes (Signed)
Pharmacy Antibiotic Note  William Bates is a 51 y.o. male admitted on 10/30/2016 with ?aspiration pneumonia.  Pharmacy has been consulted for Unasyn dosing.  Plan: Unasyn 3 grams IV Q6h   Height: 6\' 1"  (185.4 cm) Weight: 256 lb 2.8 oz (116.2 kg) IBW/kg (Calculated) : 79.9  Temp (24hrs), Avg:98.6 F (37 C), Min:98 F (36.7 C), Max:99.5 F (37.5 C)   Recent Labs Lab 10/30/16 1610 10/31/16 0407 11/01/16 0410 11/02/16 0349  WBC 12.6* 12.3* 12.9* 13.8*  CREATININE 1.48* 0.88 0.99 0.98    Estimated Creatinine Clearance: 120.4 mL/min (by C-G formula based on SCr of 0.98 mg/dL).    No Known Allergies  Antimicrobials this admission: Unasyn  6/1 >>       >>    Dose adjustments this admission:    Microbiology results:   BCx:     UCx:      Sputum:    5/29 MRSA PCR: neg  Thank you for allowing pharmacy to be a part of this patient's care.  Zeah Germano A 11/02/2016 1:08 PM

## 2016-11-02 NOTE — Care Management (Signed)
Was asked to fax additional information to [facility] regarding patient's EF.  should know within the next 2 hours whether the vest will be approved.  [facility] will notify Dr Darrold JunkerParaschos.  [facility]

## 2016-11-02 NOTE — Progress Notes (Signed)
[employer] - [place] at [facility]   PATIENT NAME: William Bates    MR#:  [id number]  DATE OF BIRTH:  04-13-1966  SUBJECTIVE:  CHIEF COMPLAINT:   Chief Complaint  Patient presents with  . Code STEMI   Came with STEMI- s/p stent 10/30/16- had seizures and v tech- code blue activated in 10/31/16 morning , intubated and started on amiodarone drip. Extubated 10/31/16. Completely alert and oriented now.   Have some cough and headache.  REVIEW OF SYSTEMS:     Review of Systems  Constitutional: Negative for fever, malaise/fatigue and weight loss.  HENT: Negative for congestion, hearing loss and sore throat.   Eyes: Negative for blurred vision and double vision.  Respiratory: Positive for cough. Negative for hemoptysis and shortness of breath.   Cardiovascular: Negative for chest pain, palpitations, orthopnea and leg swelling.  Gastrointestinal: Negative for constipation, heartburn, nausea and vomiting.  Genitourinary: Negative for dysuria and frequency.  Musculoskeletal: Negative for joint pain and myalgias.  Neurological: Negative for dizziness, tremors, focal weakness and headaches.  Psychiatric/Behavioral: Negative for depression and hallucinations.    DRUG ALLERGIES:  No Known Allergies  VITALS:  Blood pressure 117/68, pulse 65, temperature 98.4 F (36.9 C), temperature source Oral, resp. rate 12, height 6\' 1"  (1.854 m), weight 116.2 kg (256 lb 2.8 oz), SpO2 98 %.  PHYSICAL EXAMINATION:  GENERAL:  51 y.o.-year-old patient lying in the bed with no acute distress.  EYES: Pupils equal, round, reactive to light . No scleral icterus. Extraocular muscles intact.  HEENT: Head atraumatic, normocephalic. Oropharynx and nasopharynx clear.  NECK:  Supple, no jugular venous distention. No thyroid enlargement, no tenderness.  LUNGS: Normal breath sounds bilaterally, no wheezing, some crepitation. No use of accessory muscles of respiration.  CARDIOVASCULAR: S1, S2 normal.  No murmurs, rubs, or gallops.  ABDOMEN: Soft, nontender, nondistended. Bowel sounds present. No organomegaly or mass.  EXTREMITIES: No pedal edema, cyanosis, or clubbing.  NEUROLOGIC: cranial nerves intact, power 5/5 all 4 limbs, sensation intact, gait not checked. PSYCHIATRIC: The patient is alert and oriented. SKIN: No obvious rash, lesion, or ulcer.   Physical Exam LABORATORY PANEL:   CBC  Recent Labs Lab 11/02/16 0349  WBC 13.8*  HGB 14.1  HCT 40.3  PLT 157   ------------------------------------------------------------------------------------------------------------------  Chemistries   Recent Labs Lab 10/30/16 1610  11/01/16 0410 11/02/16 0349  NA 138  < > 139 139  K 4.1  < > 4.2 4.0  CL 106  < > 109 108  CO2 26  < > 25 26  GLUCOSE 158*  < > 127* 120*  BUN 20  < > 14 13  CREATININE 1.48*  < > 0.99 0.98  CALCIUM 9.1  < > 8.5* 8.5*  MG  --   --  1.7  --   AST 23  --   --   --   ALT 31  --   --   --   ALKPHOS 138*  --   --   --   BILITOT 0.5  --   --   --   < > = values in this interval not displayed. ------------------------------------------------------------------------------------------------------------------  Cardiac Enzymes  Recent Labs Lab 10/31/16 0052 10/31/16 0407  TROPONINI >65.00* >65.00*   ------------------------------------------------------------------------------------------------------------------  RADIOLOGY:  Dg Chest 1 View  Result Date: 11/01/2016 CLINICAL DATA:  Shortness of breath. EXAM: CHEST 1 VIEW COMPARISON:  10/30/2016 . FINDINGS: Mediastinum hilar structures are stable. Stable cardiomegaly. Developing atelectasis and infiltrate right lung base. Small  right pleural effusion. IMPRESSION: 1. Developing atelectasis and infiltrate right lung base. Small right pleural effusion. 2. Stable cardiomegaly. Electronically Signed   By: Maisie Fushomas  Register   On: 11/01/2016 06:17    ASSESSMENT AND PLAN:   Active Problems:   STEMI (ST  elevation myocardial infarction) (HCC)   Chest pain   Cardiac arrest, cause unspecified (HCC)   * STEMI   S/p setnt, follow per cardiology.   meds per cardiology. ( antiplatelets and B Blocker)  * v tech- post MI   Taken for cath again on 10/31/16- no new lesions.   Responded to cardioversion and then amio drip.   Manage per cardio.   Seen by EP physician   need life vest on d/c   Cardio suggested to stop amio drip , monitor for 24 hrs and increase metoprolol oral.  * Aspiration pneumonia   Episoe of v tech and intubation, infiltrates on Xray.   Unasyn for now, follow wbcs.   Encouraged to use spirometer.  * seizures suspected but ruled out.    Not sure , if seizures really present or not.   ICU physician suggested no meds at this time.   Likely it was due to vent arrhythmia.  * hyperlipidemia   Atorvastatin.  * Hypertension   Lisinopril and metoprolol.    * Ischemic cardiomyopathy    EF is 35-40% per Echo.   Lisinopril for now.   All the records are reviewed and case discussed with Care Management/Social Workerr. Management plans discussed with the patient, family and they are in agreement.  CODE STATUS: Full.  TOTAL TIME TAKING CARE OF THIS PATIENT: 35 minutes.  Pt's girl friend was present in room.  POSSIBLE D/C IN 1-2 DAYS, DEPENDING ON CLINICAL CONDITION.   Altamese DillingVACHHANI, Shalese Strahan M.D on 11/02/2016   Between 7am to 6pm - Pager - [phone]  After 6pm go to [email or web address] - password [email or web address]  [facility]  Office  [phone]  CC: Primary care physician; Patient, No Pcp Per  Note: This dictation was prepared with Dragon dictation along with smaller phrase technology. Any transcriptional errors that result from this process are unintentional.

## 2016-11-02 NOTE — Progress Notes (Signed)
[facility]  SUBJECTIVE: I don't have chest pain   Vitals:   11/01/16 1241 11/01/16 1936 11/01/16 2148 11/02/16 0401  BP: 108/76 129/76 (!) 122/59 107/61  Pulse: 69 78 82 61  Resp: 18 17  16   Temp: 98.7 F (37.1 C) 99.5 F (37.5 C) 98.5 F (36.9 C) 98 F (36.7 C)  TempSrc: Oral Oral Oral   SpO2: 98% 98%  100%  Weight:      Height:         Intake/Output Summary (Last 24 hours) at 11/02/16 16100808 Last data filed at 11/01/16 2153  Gross per 24 hour  Intake           670.34 ml  Output             2175 ml  Net         -1504.66 ml      PHYSICAL EXAM  General: Well developed, well nourished, in no acute distress HEENT:  Normocephalic and atramatic Neck:  No JVD.  Lungs: Clear bilaterally to auscultation and percussion. Heart: HRRR . Normal S1 and S2 without gallops or murmurs.  Abdomen: Bowel sounds are positive, abdomen soft and non-tender  Msk:  Back normal, normal gait. Normal strength and tone for age. Extremities: No clubbing, cyanosis or edema.   Neuro: Alert and oriented X 3. Psych:  Good affect, responds appropriately   LABS: Basic Metabolic Panel:  Recent Labs  96/09/5403/31/18 0410 11/02/16 0349  NA 139 139  K 4.2 4.0  CL 109 108  CO2 25 26  GLUCOSE 127* 120*  BUN 14 13  CREATININE 0.99 0.98  CALCIUM 8.5* 8.5*  MG 1.7  --    Liver Function Tests:  Recent Labs  10/30/16 1610  AST 23  ALT 31  ALKPHOS 138*  BILITOT 0.5  PROT 7.2  ALBUMIN 4.7   No results for input(s): LIPASE, AMYLASE in the last 72 hours. CBC:  Recent Labs  10/30/16 1610  11/01/16 0410 11/02/16 0349  WBC 12.6*  < > 12.9* 13.8*  NEUTROABS 6.2  --   --   --   HGB 15.5  < > 13.4 14.1  HCT 45.8  < > 38.4* 40.3  MCV 85.4  < > 85.5 86.1  PLT 208  < > 177 157  < > = values in this interval not displayed. Cardiac Enzymes:  Recent Labs  10/30/16 2333 10/31/16 0052 10/31/16 0407  TROPONINI >65.00* >65.00* >65.00*   BNP: Invalid input(s): POCBNP D-Dimer: No results  for input(s): DDIMER in the last 72 hours. Hemoglobin A1C:  Recent Labs  10/30/16 1610  HGBA1C 6.0*   Fasting Lipid Panel:  Recent Labs  10/31/16 0407 10/31/16 1125  CHOL 160  --   HDL 30*  --   LDLCALC 103*  --   TRIG 136 136  CHOLHDL 5.3  --    Thyroid Function Tests: No results for input(s): TSH, T4TOTAL, T3FREE, THYROIDAB in the last 72 hours.  Invalid input(s): FREET3 Anemia Panel: No results for input(s): VITAMINB12, FOLATE, FERRITIN, TIBC, IRON, RETICCTPCT in the last 72 hours.  Dg Chest 1 View  Result Date: 11/01/2016 CLINICAL DATA:  Shortness of breath. EXAM: CHEST 1 VIEW COMPARISON:  10/30/2016 . FINDINGS: Mediastinum hilar structures are stable. Stable cardiomegaly. Developing atelectasis and infiltrate right lung base. Small right pleural effusion. IMPRESSION: 1. Developing atelectasis and infiltrate right lung base. Small right pleural effusion. 2. Stable cardiomegaly. Electronically Signed   By: Maisie Fushomas  Register   On: 11/01/2016  06:17   Dg Abd Portable 1v  Result Date: 10/31/2016 CLINICAL DATA:  Orogastric tube placement. EXAM: PORTABLE ABDOMEN - 1 VIEW COMPARISON:  10/18/2015. FINDINGS: NG tube noted with tip projected over superior vena cava. Degenerative changes lumbar spine. No acute or focal bony abnormality identified. Bibasilar subsegmental atelectasis and/or infiltrates. IMPRESSION: 1. NG tube noted with its tip projected over superior vena cava. 2.  Right bibasilar subsegmental atelectasis and/or infiltrates. Electronically Signed   By: Maisie Fus  Register   On: 10/31/2016 12:29     Echo LV EF 35-40% with anterior apical hypokinesis  TELEMETRY: Sinus rhythm:  ASSESSMENT AND PLAN:  Active Problems:   STEMI (ST elevation myocardial infarction) (HCC)   Chest pain   Cardiac arrest, cause unspecified (HCC)    1. Anterior STEMI, status post primary PCI with DES proximal LAD 2. V. fib arrest, patent stent proximal LAD by cardiac catheterization 3.  Ischemic cardiomyopathy, LVEF 35-40%  Recommendations  1. Continue dual antiplatelet therapy uninterrupted for 1 year 2. DC amiodarone drip per Dr. Sherryl Manges 3. Increase metoprolol to 25 mg twice a day 4. Increase activity today 5. Plan to discharge 11/03/2016. Patient will require LifeVest prior to discharge with planned follow-up with Dr. Sherryl Manges in 3 months.   Marcina Millard, MD, PhD, [facility] 11/02/2016 8:08 AM

## 2016-11-02 NOTE — Consult Note (Signed)
The patient is status post anterior STEMI, DES proximal LAD 10/30/2016. Patient experienced V. fib arrest on 10/31/2016, cardiac catheterization revealed patent stent. 2-D echocardiogram revealed LV ejection fraction of 35-40%. The patient is at high risk for recurrent ventricular fibrillation arrest, and requires LifeVest prior to discharge.

## 2016-11-02 NOTE — Care Management (Signed)
[facility]ax[facility]als[facility]

## 2016-11-02 NOTE — Progress Notes (Signed)
[facility]  Patient: William Bates / Admit Date: 10/30/2016 / Date of Encounter: 11/02/2016, 5:26 PM   Subjective: Patient is recovering from significant anterior and apical myocardial infarction and ST elevation myocardial infarction. Patient received appropriate PCI and stent placement and is pain-free at this time. After myocardial infarction he had a ventricular fibrillation arrest for which she was resuscitated and was placed on appropriate medication management including amiodarone. Since then there is been no evidence of significant new rhythm disturbances. His been placed on high intensity cholesterol therapy metoprolol lisinopril and Proventil and aspirin. All appropriate medication management for current concerns. Based on current guidelines the patient should have a life vest or a defibrillator placement due to the fact that the patient had a post interventional and post myocardial infarction arrest. Currently his echocardiogram shows LV systolic dysfunction and severe akinesis of the apical and distal anterior and anterior septal wall with ejection fraction of 25-30% electrophysiology has suggested not to place a defibrillator at this time and therefore a LifeVest would be most appropriate until the patient can obtain a defibrillator.  Signed, Arnoldo HookerBruce Tynleigh Birt M.D. FACC

## 2016-11-03 LAB — CBC
HEMATOCRIT: 40.2 % (ref 40.0–52.0)
HEMOGLOBIN: 14 g/dL (ref 13.0–18.0)
MCH: 29.8 pg (ref 26.0–34.0)
MCHC: 34.8 g/dL (ref 32.0–36.0)
MCV: 85.5 fL (ref 80.0–100.0)
Platelets: 157 10*3/uL (ref 150–440)
RBC: 4.7 MIL/uL (ref 4.40–5.90)
RDW: 13.5 % (ref 11.5–14.5)
WBC: 12.9 10*3/uL — AB (ref 3.8–10.6)

## 2016-11-03 MED ORDER — LISINOPRIL 5 MG PO TABS: 5.0000 mg | ORAL_TABLET | Freq: Every day | ORAL | 0 refills | Status: DC

## 2016-11-03 MED ORDER — METOPROLOL TARTRATE 25 MG PO TABS
25.0000 mg | ORAL_TABLET | Freq: Two times a day (BID) | ORAL | 0 refills | Status: DC
Start: 2016-11-03 — End: 2016-11-04

## 2016-11-03 MED ORDER — ATORVASTATIN CALCIUM 40 MG PO TABS: 40.0000 mg | ORAL_TABLET | Freq: Every day | ORAL | 0 refills | Status: DC

## 2016-11-03 MED ORDER — TICAGRELOR 90 MG PO TABS
90.0000 mg | ORAL_TABLET | Freq: Two times a day (BID) | ORAL | 0 refills | Status: DC
Start: 2016-11-03 — End: 2021-01-12

## 2016-11-03 MED ORDER — ASPIRIN 81 MG PO CHEW: 81.0000 mg | CHEWABLE_TABLET | Freq: Every day | ORAL | Status: AC

## 2016-11-03 NOTE — Progress Notes (Signed)
[facility]         [place]   11/03/2016  Patient: William FavaSteven Bates   Date of Birth:  02-28-1966  Date of admission:  10/30/2016  Date of Discharge  11/03/2016    To Whom it May Concern:   William Bates  may return to work on 11/19/2016.  If you have any questions or concerns, please don't hesitate to call.  Sincerely,   Milagros LollSudini, Tiyah Zelenak R M.D Office : [phone]   .

## 2016-11-03 NOTE — Progress Notes (Signed)
[employer] - [place] at [facility]   PATIENT NAME: William FavaSteven Bates    MR#:  [id number]  DATE OF BIRTH:  1966-04-02  SUBJECTIVE:  CHIEF COMPLAINT:   Chief Complaint  Patient presents with  . Code STEMI   Came with STEMI- s/p stent 10/30/16- had seizures and v tech- code blue activated in 10/31/16 morning , intubated and started on amiodarone drip. Extubated 10/31/16. Completely alert and oriented now.   No chest pain. Some cough.  REVIEW OF SYSTEMS:     Review of Systems  Constitutional: Negative for fever, malaise/fatigue and weight loss.  HENT: Negative for congestion, hearing loss and sore throat.   Eyes: Negative for blurred vision and double vision.  Respiratory: Positive for cough. Negative for hemoptysis and shortness of breath.   Cardiovascular: Negative for chest pain, palpitations, orthopnea and leg swelling.  Gastrointestinal: Negative for constipation, heartburn, nausea and vomiting.  Genitourinary: Negative for dysuria and frequency.  Musculoskeletal: Negative for joint pain and myalgias.  Neurological: Negative for dizziness, tremors, focal weakness and headaches.  Psychiatric/Behavioral: Negative for depression and hallucinations.    DRUG ALLERGIES:  No Known Allergies  VITALS:  Blood pressure 108/61, pulse (!) 57, temperature 98.2 F (36.8 C), temperature source Oral, resp. rate 20, height 6\' 1"  (1.854 m), weight 116.2 kg (256 lb 2.8 oz), SpO2 96 %.  PHYSICAL EXAMINATION:  GENERAL:  51 y.o.-year-old patient lying in the bed with no acute distress.  EYES: Pupils equal, round, reactive to light . No scleral icterus. Extraocular muscles intact.  HEENT: Head atraumatic, normocephalic. Oropharynx and nasopharynx clear.  NECK:  Supple, no jugular venous distention. No thyroid enlargement, no tenderness.  LUNGS: Normal breath sounds bilaterally, no wheezing, some crepitation. No use of accessory muscles of respiration.  CARDIOVASCULAR: S1, S2  normal. No murmurs, rubs, or gallops.  ABDOMEN: Soft, nontender, nondistended. Bowel sounds present. No organomegaly or mass.  EXTREMITIES: No pedal edema, cyanosis, or clubbing.  NEUROLOGIC: cranial nerves intact, power 5/5 all 4 limbs, sensation intact, gait not checked. PSYCHIATRIC: The patient is alert and oriented. SKIN: No obvious rash, lesion, or ulcer.   Physical Exam LABORATORY PANEL:   CBC  Recent Labs Lab 11/03/16 0443  WBC 12.9*  HGB 14.0  HCT 40.2  PLT 157   ------------------------------------------------------------------------------------------------------------------  Chemistries   Recent Labs Lab 10/30/16 1610  11/01/16 0410 11/02/16 0349  NA 138  < > 139 139  K 4.1  < > 4.2 4.0  CL 106  < > 109 108  CO2 26  < > 25 26  GLUCOSE 158*  < > 127* 120*  BUN 20  < > 14 13  CREATININE 1.48*  < > 0.99 0.98  CALCIUM 9.1  < > 8.5* 8.5*  MG  --   --  1.7  --   AST 23  --   --   --   ALT 31  --   --   --   ALKPHOS 138*  --   --   --   BILITOT 0.5  --   --   --   < > = values in this interval not displayed. ------------------------------------------------------------------------------------------------------------------  Cardiac Enzymes  Recent Labs Lab 10/31/16 0052 10/31/16 0407  TROPONINI >65.00* >65.00*   ------------------------------------------------------------------------------------------------------------------  RADIOLOGY:  No results found.  ASSESSMENT AND PLAN:   Active Problems:   STEMI (ST elevation myocardial infarction) (HCC)   Chest pain   Cardiac arrest, cause unspecified (HCC)   * STEMI   S/p  Drug-eluting stent to LAD , follow per cardiology.    on aspirin and present. Statin, metoprolol and lisinopril.   * Ventricular tachycardia - post MI   Taken for cath again on 10/31/16- no new lesions.   Responded to cardioversion and then amio drip.   Seen by EP - Dr. Graciela Husbands   needs life vest on d/c Amiodarone*by cardiology. Now  on metoprolol.  * Aspiration pneumonia   RLL infiltrates on Xray.   Unasyn for now, follow wbcs.   Encouraged to use spirometer. Augmentin at discharge  * Seizures suspected but ruled out.    Not sure , if seizures really present or not.   ICU physician suggested no meds at this time.   Likely it was due to ventricular arrhythmia.  * Hyperlipidemia   Atorvastatin.  * Hypertension   Lisinopril and metoprolol.    * Ischemic cardiomyopathy    EF is 35-40% per Echo.   Coreg and lisinopril   All the records are reviewed and case discussed with Care Management/Social Workerr. Management plans discussed with the patient, family and they are in agreement.  CODE STATUS: Full.  TOTAL TIME TAKING CARE OF THIS PATIENT: 35 minutes.   Discharged home and will resolve LifeVest available  Orie Fisherman M.D on 11/03/2016   Between 7am to 6pm - Pager - [phone]  After 6pm go to [email or web address] - password [email or web address]  [facility] [place] [facility]  Office  [phone]  CC: Primary care physician; Patient, No Pcp Per  Note: This dictation was prepared with Dragon dictation along with smaller phrase technology. Any transcriptional errors that result from this process are unintentional.

## 2016-11-03 NOTE — Care Management Note (Signed)
Case Management Note  Patient Details  Name: William Bates MRN: [id number] Date of Birth: [birth year]  Subjective/Objective:    Call to [facility] and spoke with Sharyl NimrodMeredith who reports that [facility] still does not have insurance authorization for a LifeVest from [employer]. Sharyl NimrodMeredith reports having been on the phone with Monia Pouchetna this morning concerning the authorization. Sharyl NimrodMeredith will advise as soon as the authorization is approved by [employer] but she is not sure whether or not it will be today. This Clinical research associatewriter called Dr Elpidio AnisSudini to update him.                Action/Plan:   Expected Discharge Date:  10/31/16               Expected Discharge Plan:     In-House Referral:     Discharge planning Services     Post Acute Care Choice:  Durable Medical Equipment Choice offered to:  Patient  DME Arranged:  Life vest DME Agency:   [facility]  HH Arranged:    HH Agency:     Status of Service:  In process, will continue to follow  If discussed at Long Length of Stay Meetings, dates discussed:    Additional Comments:  Morgan Keinath A, RN 11/03/2016, 10:49 AM

## 2016-11-04 MED ORDER — METOPROLOL TARTRATE 25 MG PO TABS: 12.5000 mg | ORAL_TABLET | Freq: Two times a day (BID) | ORAL | 0 refills | Status: DC

## 2016-11-04 MED ORDER — LISINOPRIL 2.5 MG PO TABS: 2.5000 mg | ORAL_TABLET | Freq: Every day | ORAL | 0 refills | Status: AC

## 2016-11-04 MED ORDER — AMOXICILLIN-POT CLAVULANATE 875-125 MG PO TABS: 1.0000 | ORAL_TABLET | Freq: Two times a day (BID) | ORAL | 0 refills | Status: AC

## 2016-11-04 MED ORDER — METOPROLOL TARTRATE 25 MG PO TABS
12.5000 mg | ORAL_TABLET | Freq: Two times a day (BID) | ORAL | Status: DC
  Filled 2016-11-04: qty 1

## 2016-11-04 NOTE — Progress Notes (Signed)
[facility]  Patient: William GripSteven J Alcalde / Admit Date: 10/30/2016 / Date of Encounter: 11/04/2016, 6:52 AM   Subjective: Patient feeling well today. No evidence of chest pain. No evidence of heart failure. Patient does have some bradycardia likely secondary to higher dose of the metoprolol. Patient has not had any ventricular fibrillation after first event  Review of Systems: Positive for: Weakness Negative for: Vision change, hearing change, syncope, dizziness, nausea, vomiting,diarrhea, bloody stool, stomach pain, cough, congestion, diaphoresis, urinary frequency, urinary pain,skin lesions, skin rashes Others previously listed  Objective: Telemetry: Sinus bradycardia Physical Exam: Blood pressure (!) 105/50, pulse (!) 52, temperature 98.1 F (36.7 C), temperature source Oral, resp. rate 18, height 6\' 1"  (1.854 m), weight 116.2 kg (256 lb 2.8 oz), SpO2 99 %. Body mass index is 33.8 kg/m. General: Well developed, well nourished, in no acute distress. Head: Normocephalic, atraumatic, sclera non-icteric, no xanthomas, nares are without discharge. Neck: No apparent masses Lungs: Normal respirations with no wheezes, no rhonchi, no rales , no crackles   Heart: Regular rate and rhythm, normal S1 S2, no murmur, no rub, no gallop, PMI is normal size and placement, carotid upstroke normal without bruit, jugular venous pressure normal Abdomen: Soft, non-tender, non-distended with normoactive bowel sounds. No hepatosplenomegaly. Abdominal aorta is normal size without bruit Extremities: No edema, no clubbing, no cyanosis, no ulcers,  Peripheral: 2+ radial, 2+ femoral, 2+ dorsal pedal pulses Neuro: Alert and oriented. Moves all extremities spontaneously. Psych:  Responds to questions appropriately with a normal affect.   Intake/Output Summary (Last 24 hours) at 11/04/16 0652 Last data filed at 11/04/16 0546  Gross per 24 hour  Intake              680 ml  Output               800 ml  Net             -120 ml    Inpatient Medications:  . aspirin  324 mg Oral Once  . aspirin  81 mg Oral Daily  . atorvastatin  40 mg Oral q1800  . enoxaparin (LOVENOX) injection  40 mg Subcutaneous Q24H  . lisinopril  5 mg Oral Daily  . metoprolol tartrate  25 mg Oral BID  . sodium chloride flush  3 mL Intravenous Q12H  . ticagrelor  90 mg Oral BID   Infusions:  . sodium chloride 20 mL/hr at 11/01/16 0900  . ampicillin-sulbactam (UNASYN) IV Stopped (11/04/16 0616)    Labs:  Recent Labs  11/02/16 0349  NA 139  K 4.0  CL 108  CO2 26  GLUCOSE 120*  BUN 13  CREATININE 0.98  CALCIUM 8.5*   No results for input(s): AST, ALT, ALKPHOS, BILITOT, PROT, ALBUMIN in the last 72 hours.  Recent Labs  11/02/16 0349 11/03/16 0443  WBC 13.8* 12.9*  HGB 14.1 14.0  HCT 40.3 40.2  MCV 86.1 85.5  PLT 157 157   No results for input(s): CKTOTAL, CKMB, TROPONINI in the last 72 hours. Invalid input(s): POCBNP No results for input(s): HGBA1C in the last 72 hours.   Weights: Filed Weights   10/30/16 1811  Weight: 116.2 kg (256 lb 2.8 oz)     Radiology/Studies:  Dg Chest 1 View  Result Date: 11/01/2016 CLINICAL DATA:  Shortness of breath. EXAM: CHEST 1 VIEW COMPARISON:  10/30/2016 . FINDINGS: Mediastinum hilar structures are stable. Stable cardiomegaly. Developing atelectasis and infiltrate right lung base. Small right pleural effusion.  IMPRESSION: 1. Developing atelectasis and infiltrate right lung base. Small right pleural effusion. 2. Stable cardiomegaly. Electronically Signed   By: Maisie Fus  Register   On: 11/01/2016 06:17   Dg Chest Port 1 View  Result Date: 10/30/2016 CLINICAL DATA:  STEMI in progress with chest pain and dyspnea. Concern for dissection. EXAM: PORTABLE CHEST 1 VIEW COMPARISON:  None. FINDINGS: The heart size and mediastinal contours are within normal limits. No mediastinal widening. External defibrillator paddles project over the left hemithorax  and cardiac silhouette. No pneumothorax or pneumonic consolidation. Slight central vascular congestion. The visualized skeletal structures are unremarkable. IMPRESSION: No mediastinal widening. Slight vascular congestion. No acute pulmonary disease. Electronically Signed   By: Tollie Eth M.D.   On: 10/30/2016 16:30   Dg Abd Portable 1v  Result Date: 10/31/2016 CLINICAL DATA:  Orogastric tube placement. EXAM: PORTABLE ABDOMEN - 1 VIEW COMPARISON:  10/18/2015. FINDINGS: NG tube noted with tip projected over superior vena cava. Degenerative changes lumbar spine. No acute or focal bony abnormality identified. Bibasilar subsegmental atelectasis and/or infiltrates. IMPRESSION: 1. NG tube noted with its tip projected over superior vena cava. 2.  Right bibasilar subsegmental atelectasis and/or infiltrates. Electronically Signed   By: Maisie Fus  Register   On: 10/31/2016 12:29     Assessment and Recommendation  51 y.o. male with acute anterior septal and apical myocardial infarction after occlusion of the left anterior descending artery with the ST elevation myocardial infarction and status post ventricular fibrillation arrest the day after placed on appropriate medication management with no further event with significant to LV systolic dysfunction with ejection fraction 25-30% and apical septal and anterior akinesis. The patient has had no further heart failure or rhythm disturbances but will need of LifeVest due to the delay and defer of defibrillator at this time 1. Decrease metoprolol dose due to significant bradycardia 2. LifeVest as able for further risk reduction of ventricular fibrillation arrest until defibrillator Orvan Falconer in place 3. ACE inhibitor for myocardial infarction 4. Aspirin and [place] to for further dual antiplatelet therapy  Signed, Arnoldo Hooker M.D. FACC

## 2016-11-04 NOTE — Care Management Note (Signed)
Case Management Note  Patient Details  Name: Trudee GripSteven J Cleaver MRN: [id number] Date of Birth: September 03, 1965  Subjective/Objective:      Received a text message from [place]eredith at [employer] reporting that [employer] has received authorization for the [employer] from [employer] insurance. [employer] will deliver and fit the LifeVest to Mr Cliffton AstersWhite this morning. Dr Elpidio AnisSudini and Mr Cl[facility]stersWhite were updated. This Clinical research associatewriter phoned Lisette AbuAmy Miller at [facility] request to verify with her that she has Mr [employer]'s Brilinta coupon and she confirmed that she does have it. Case management will continue to follow until discharge home later today.               Action/Plan:   Expected Discharge Date:  10/31/16               Expected Discharge Plan:   11/04/16  In-House Referral:     Discharge planning Services   Home with LifeVest  Post Acute Care Choice:  Durable Medical Equipment Choice offered to:  Patient  DME Arranged:  Life vest DME Agency:   [facility]  HH Arranged:   NA HH Agency:    NA Status of Service:  In process, will continue to follow 11/04/16 completed,  If discussed at Long Length of Stay Meetings, dates discussed:    Additional Comments:  Raenah Murley A, RN 11/04/2016, 8:29 AM

## 2016-11-04 NOTE — Plan of Care (Signed)
Problem: Activity: Goal: Ability to tolerate increased activity will improve Outcome: Progressing Tolerated ambulation in hallway without complaints of pain.  No distress noted.

## 2016-11-05 NOTE — Discharge Summary (Addendum)
[facility] [place] [facility]   PATIENT NAME: William Bates    MR#:  [id number]  DATE OF BIRTH:  [birth year]  DATE OF ADMISSION:  10/30/2016 ADMITTING PHYSICIAN: Milagros Loll, MD  DATE OF DISCHARGE: 11/04/2016 12:07 PM  PRIMARY CARE PHYSICIAN: Patient, No Pcp Per   ADMISSION DIAGNOSIS:  ST elevation myocardial infarction (STEMI), unspecified artery (HCC) [I21.3] Chest pain, unspecified type [R07.9] STEMI (ST elevation myocardial infarction) (HCC) [I21.3]  DISCHARGE DIAGNOSIS:  Active Problems:   STEMI (ST elevation myocardial infarction) (HCC)   Chest pain   Cardiac arrest, cause unspecified (HCC)   SECONDARY DIAGNOSIS:   Past Medical History:  Diagnosis Date  . Alcohol abuse   . Pre-diabetes   . Tobacco use      ADMITTING HISTORY  HISTORY OF PRESENT ILLNESS:  William Bates  is a 51 y.o. male with a known history of pre-diabetes, smoking presented to ED with worsening exrtional CP for 3 weeks. Found to have STEMI. Cath with PCI to LAD. Admitted to ICU. Smoker. No premature CAD in family   HOSPITAL COURSE:   * STEMI   S/p Drug-eluting stent to LAD    on aspirin, Brillinta. Statin, metoprolol and lisinopril.   * Ventricular tachycardia - post MI   Taken for cath again on 10/31/16- no new lesions.   Responded to cardioversion and then amio drip.   Seen by EP - Dr. Graciela Husbands Amiodarone Stopped  by cardiology. Now on metoprolol. Patient had LifeVest was all placed prior to discharge. Instructions given.  * Aspiration pneumonia   RLL infiltrates on Xray.    started on IV Unasyn. Change to by mouth Augmentin at discharge.   Encouraged to use spirometer.  * Seizures suspected but ruled out.    Not sure , if seizures really present or not.   ICU physician suggested no meds at this time.   Likely it was due to ventricular arrhythmia.  * Hyperlipidemia   Atorvastatin.  * Hypertension   Lisinopril and metoprolol.    * Ischemic  cardiomyopathy    EF is 35-40% per Echo.   Coreg and lisinopril  Counseled patient extensively regarding follow-up with cardiology, cardiac rehabilitation.  CONSULTS OBTAINED:  Treatment Team:  Marcina Millard, MD  DRUG ALLERGIES:  No Known Allergies  DISCHARGE MEDICATIONS:   Discharge Medication List as of 11/04/2016 11:41 AM    START taking these medications   Details  aspirin 81 MG chewable tablet Chew 1 tablet (81 mg total) by mouth daily., Starting Sun 11/04/2016, No Print    atorvastatin (LIPITOR) 40 MG tablet Take 1 tablet (40 mg total) by mouth daily at 6 PM., Starting Sat 11/03/2016, Normal    ticagrelor (BRILINTA) 90 MG TABS tablet Take 1 tablet (90 mg total) by mouth 2 (two) times daily., Starting Sat 11/03/2016, Normal      CONTINUE these medications which have CHANGED   Details  lisinopril (PRINIVIL,ZESTRIL) 2.5 MG tablet Take 1 tablet (2.5 mg total) by mouth daily., Starting Sun 11/04/2016, Normal    metoprolol tartrate (LOPRESSOR) 25 MG tablet Take 0.5 tablets (12.5 mg total) by mouth 2 (two) times daily., Starting Sun 11/04/2016, Normal      CONTINUE these medications which have NOT CHANGED   Details  acetaminophen (TYLENOL) 500 MG tablet Take 500 mg by mouth every 6 (six) hours as needed., Historical Med    naproxen sodium (ANAPROX) 220 MG tablet Take 220 mg by mouth 2 (two) times daily with a meal., Historical Med  STOP taking these medications     aspirin 325 MG tablet      diazepam (VALIUM) 2 MG tablet      meloxicam (MOBIC) 15 MG tablet      metaxalone (SKELAXIN) 800 MG tablet         Today   VITAL SIGNS:  Blood pressure 91/62, pulse (!) 50, temperature 97.8 F (36.6 C), temperature source Oral, resp. rate 16, height 6\' 1"  (1.854 m), weight 116.2 kg (256 lb 2.8 oz), SpO2 94 %.  I/O:  No intake or output data in the 24 hours ending 11/05/16 1319  PHYSICAL EXAMINATION:  Physical Exam  GENERAL:  51 y.o.-year-old patient lying in the bed  with no acute distress.  LUNGS: Normal breath sounds bilaterally, no wheezing, rales,rhonchi or crepitation. No use of accessory muscles of respiration.  CARDIOVASCULAR: S1, S2 normal. No murmurs, rubs, or gallops.  ABDOMEN: Soft, non-tender, non-distended. Bowel sounds present. No organomegaly or mass.  NEUROLOGIC: Moves all 4 extremities. PSYCHIATRIC: The patient is alert and oriented x 3.  SKIN: No obvious rash, lesion, or ulcer.   DATA REVIEW:   CBC  Recent Labs Lab 11/03/16 0443  WBC 12.9*  HGB 14.0  HCT 40.2  PLT 157    Chemistries   Recent Labs Lab 10/30/16 1610  11/01/16 0410 11/02/16 0349  NA 138  < > 139 139  K 4.1  < > 4.2 4.0  CL 106  < > 109 108  CO2 26  < > 25 26  GLUCOSE 158*  < > 127* 120*  BUN 20  < > 14 13  CREATININE 1.48*  < > 0.99 0.98  CALCIUM 9.1  < > 8.5* 8.5*  MG  --   --  1.7  --   AST 23  --   --   --   ALT 31  --   --   --   ALKPHOS 138*  --   --   --   BILITOT 0.5  --   --   --   < > = values in this interval not displayed.  Cardiac Enzymes  Recent Labs Lab 10/31/16 0407  TROPONINI >65.00*    Microbiology Results  Results for orders placed or performed during the hospital encounter of 10/30/16  MRSA PCR Screening     Status: None   Collection Time: 10/30/16  6:14 PM  Result Value Ref Range Status   MRSA by PCR NEGATIVE NEGATIVE Final    Comment:        The GeneXpert MRSA Assay (FDA approved for NASAL specimens only), is one component of a comprehensive MRSA colonization surveillance program. It is not intended to diagnose MRSA infection nor to guide or monitor treatment for MRSA infections.     RADIOLOGY:  No results found.  Follow up with PCP in 1 week.  Management plans discussed with the patient, family and they are in agreement.  CODE STATUS:  Code Status History    Date Active Date Inactive Code Status Order ID Comments User Context   10/30/2016  5:44 PM 11/04/2016  3:08 PM Full Code [id number]  Marcina MillardParaschos,  Alexander, MD Inpatient      TOTAL TIME TAKING CARE OF THIS PATIENT ON DAY OF DISCHARGE: more than 30 minutes.   Milagros LollSudini, Takeia Ciaravino R M.D on 11/05/2016 at 1:19 PM  Between 7am to 6pm - Pager - [phone]  After 6pm go to [email or web address] - password [email or web address] [facility]  [facility] [place] [facility]  Office  [phone]  CC: Primary care physician; Patient, No Pcp Per  Note: This dictation was prepared with Dragon dictation along with smaller phrase technology. Any transcriptional errors that result from this process are unintentional.

## 2016-11-19 ENCOUNTER — Encounter: Payer: 59 | Attending: Cardiology | Admitting: *Deleted

## 2016-11-19 VITALS — Ht 72.9 in | Wt 250.3 lb

## 2016-11-19 DIAGNOSIS — I213 ST elevation (STEMI) myocardial infarction of unspecified site: Secondary | ICD-10-CM | POA: Diagnosis not present

## 2016-11-19 DIAGNOSIS — Z955 Presence of coronary angioplasty implant and graft: Secondary | ICD-10-CM | POA: Insufficient documentation

## 2016-11-19 NOTE — Progress Notes (Signed)
Daily Session Note  Patient Details  Name: William Bates MRN: [id number] Date of Birth: 1966/02/13 Referring Provider:    Encounter Date: 11/19/2016  Check In:     Session Check In - 11/19/16 1243      Check-In   Location [facility]   Staff Present Alberteen Sam, MA, ACSM RCEP, Exercise Physiologist;Zakariye Nee, RN, BSN   Supervising physician immediately available to respond to emergencies See telemetry face sheet for immediately available ER MD   Medication changes reported     No   Fall or balance concerns reported    No   Tobacco Cessation No Change   Warm-up and Cool-down Performed as group-led instruction   Resistance Training Performed Yes   VAD Patient? No     Pain Assessment   Currently in Pain? No/denies         History  Smoking Status  . Former Smoker  . Packs/day: 0.50  . Years: 30.00  . Quit date: 10/30/2016  Smokeless Tobacco  . Former Systems developer    Comment: 1/2 ppd    Goals Met:  Proper associated with RPD/PD & O2 Sat Exercise tolerated well Personal goals reviewed No report of cardiac concerns or symptoms Strength training completed today  Goals Unmet:  Not Applicable  Comments:     Dr. Emily Filbert is Medical Director for [name].

## 2016-11-19 NOTE — Progress Notes (Signed)
Cardiac Individual Treatment Plan  Patient Details  Name: William Bates MRN: [id number] Date of Birth: 1966/05/12 Referring Provider:     Cardiac Rehab from 11/19/2016 in [facility] Cardiac and Pulmonary Rehab  Referring Provider  Marcina Millard MD      Initial Encounter Date:    Cardiac Rehab from 11/19/2016 in [facility] Cardiac and Pulmonary Rehab  Date  11/19/16  Referring Provider  Marcina Millard MD      Visit Diagnosis: ST elevation myocardial infarction (STEMI), unspecified artery [facility])  Status post coronary artery stent placement  Patient's Home Medications on Admission:  Current Outpatient Prescriptions:  .  aspirin 81 MG chewable tablet, Chew 1 tablet (81 mg total) by mouth daily., Disp: , Rfl:  .  atorvastatin (LIPITOR) 40 MG tablet, Take 1 tablet (40 mg total) by mouth daily at 6 PM., Disp: 30 tablet, Rfl: 0 .  lisinopril (PRINIVIL,ZESTRIL) 2.5 MG tablet, Take 1 tablet (2.5 mg total) by mouth daily., Disp: 30 tablet, Rfl: 0 .  metoprolol succinate (TOPROL-XL) 25 MG 24 hr tablet, Take by mouth., Disp: , Rfl:  .  ticagrelor (BRILINTA) 90 MG TABS tablet, Take 1 tablet (90 mg total) by mouth 2 (two) times daily., Disp: 60 tablet, Rfl: 0 .  acetaminophen (TYLENOL) 500 MG tablet, Take 500 mg by mouth every 6 (six) hours as needed., Disp: , Rfl:  .  naproxen sodium (ANAPROX) 220 MG tablet, Take 220 mg by mouth 2 (two) times daily with a meal., Disp: , Rfl:   Past Medical History: Past Medical History:  Diagnosis Date  . Alcohol abuse   . Pre-diabetes   . Tobacco use     Tobacco Use: History  Smoking Status  . Former Smoker  . Packs/day: 0.50  . Years: 30.00  . Quit date: 10/30/2016  Smokeless Tobacco  . Former Neurosurgeon    Comment: 1/2 ppd    Labs: Recent Airline pilot for ITP Cardiac and Pulmonary Rehab Latest Ref Rng & Units 10/30/2016 10/31/2016 10/31/2016   Cholestrol 0 - 200 mg/dL 409(W) 119 -   LDLCALC 0 - 99 mg/dL 147(W) 295(A) -   HDL >21  mg/dL 30(Q) 65(H) -   Trlycerides <150 mg/dL 846(N) 629 528   Hemoglobin A1c 4.8 - 5.6 % 6.0(H) - -       Exercise Target Goals: Date: 11/19/16  Exercise Program Goal: Individual exercise prescription set with THRR, safety & activity barriers. Participant demonstrates ability to understand and report RPE using BORG scale, to self-measure pulse accurately, and to acknowledge the importance of the exercise prescription.  Exercise Prescription Goal: Starting with aerobic activity 30 plus minutes a day, 3 days per week for initial exercise prescription. Provide home exercise prescription and guidelines that participant acknowledges understanding prior to discharge.  Activity Barriers & Risk Stratification:     Activity Barriers & Cardiac Risk Stratification - 11/19/16 1247      Activity Barriers & Cardiac Risk Stratification   Activity Barriers Arthritis;Joint Problems;Deconditioning;Muscular Weakness;Decreased Ventricular Function  multiple injuries to r knee, meniscus tears and ACL tears   Cardiac Risk Stratification High      6 Minute Walk:     6 Minute Walk    Row Name 11/19/16 1424         6 Minute Walk   Phase Initial     Distance 1545 feet     Walk Time 6 minutes     # of Rest Breaks 0  MPH 2.93     METS 4.09     RPE 7     VO2 Peak 14.31     Symptoms No     Resting HR 45 bpm     Resting BP 112/64     Max Ex. HR 77 bpm     Max Ex. BP 164/66     2 Minute Post BP 128/70        Oxygen Initial Assessment:   Oxygen Re-Evaluation:   Oxygen Discharge (Final Oxygen Re-Evaluation):   Initial Exercise Prescription:     Initial Exercise Prescription - 11/19/16 1400      Date of Initial Exercise RX and Referring Provider   Date 11/19/16   Referring Provider Paraschos, Alexander MD     Treadmill   MPH 3   Grade 1   Minutes 15   METs 3.71     Elliptical   Level 1   Speed 4   Minutes 15     REL-XR   Level 3   Speed 50   Minutes 15   METs 3      Prescription Details   Frequency (times per week) 3   Duration Progress to 45 minutes of aerobic exercise without signs/symptoms of physical distress     Intensity   THRR 40-80% of Max Heartrate 96-145   Ratings of Perceived Exertion 11-13   Perceived Dyspnea 0-4     Progression   Progression Continue to progress workloads to maintain intensity without signs/symptoms of physical distress.     Resistance Training   Training Prescription Yes   Weight 4 lbs   Reps 10-15      Perform Capillary Blood Glucose checks as needed.  Exercise Prescription Changes:      Exercise Prescription Changes    Row Name 11/19/16 1400             Response to Exercise   Blood Pressure (Admit) 112/64       Blood Pressure (Exercise) 164/66       Blood Pressure (Exit) 128/70       Heart Rate (Admit) 45 bpm       Heart Rate (Exercise) 77 bpm       Heart Rate (Exit) 47 bpm       Oxygen Saturation (Admit) 99 %       Oxygen Saturation (Exercise) 100 %       Rating of Perceived Exertion (Exercise) 7       Symptoms none       Comments walk test results          Exercise Comments:   Exercise Goals and Review:      Exercise Goals    Row Name 11/19/16 1427             Exercise Goals   Increase Physical Activity Yes       Intervention Provide advice, education, support and counseling about physical activity/exercise needs.;Develop an individualized exercise prescription for aerobic and resistive training based on initial evaluation findings, risk stratification, comorbidities and participant's personal goals.       Expected Outcomes Achievement of increased cardiorespiratory fitness and enhanced flexibility, muscular endurance and strength shown through measurements of functional capacity and personal statement of participant.       Increase Strength and Stamina Yes       Intervention Provide advice, education, support and counseling about physical activity/exercise needs.;Develop  an individualized exercise prescription for aerobic and resistive training based on initial evaluation  findings, risk stratification, comorbidities and participant's personal goals.       Expected Outcomes Achievement of increased cardiorespiratory fitness and enhanced flexibility, muscular endurance and strength shown through measurements of functional capacity and personal statement of participant.          Exercise Goals Re-Evaluation :   Discharge Exercise Prescription (Final Exercise Prescription Changes):     Exercise Prescription Changes - 11/19/16 1400      Response to Exercise   Blood Pressure (Admit) 112/64   Blood Pressure (Exercise) 164/66   Blood Pressure (Exit) 128/70   Heart Rate (Admit) 45 bpm   Heart Rate (Exercise) 77 bpm   Heart Rate (Exit) 47 bpm   Oxygen Saturation (Admit) 99 %   Oxygen Saturation (Exercise) 100 %   Rating of Perceived Exertion (Exercise) 7   Symptoms none   Comments walk test results      Nutrition:  Target Goals: Understanding of nutrition guidelines, daily intake of sodium 1500mg , cholesterol 200mg , calories 30% from fat and 7% or less from saturated fats, daily to have 5 or more servings of fruits and vegetables.  Biometrics:     Pre Biometrics - 11/19/16 1427      Pre Biometrics   Height 6' 0.9" (1.852 m)   Weight 250 lb 4.8 oz (113.5 kg)   Waist Circumference 41 inches   Hip Circumference 42 inches   Waist to Hip Ratio 0.98 %   BMI (Calculated) 33.2   Single Leg Stand 30 seconds       Nutrition Therapy Plan and Nutrition Goals:     Nutrition Therapy & Goals - 11/19/16 1247      Nutrition Therapy   RD appointment defered Yes   Drug/Food Interactions Statins/Certain Fruits     Personal Nutrition Goals   Comments Delio states he is now eating healthier like veggie burgers, drinking alot of water, eating more vegetables. Cutting out fatty foods.     Intervention Plan   Intervention Prescribe, educate and counsel  regarding individualized specific dietary modifications aiming towards targeted core components such as weight, hypertension, lipid management, diabetes, heart failure and other comorbidities.   Expected Outcomes Short Term Goal: Understand basic principles of dietary content, such as calories, fat, sodium, cholesterol and nutrients.      Nutrition Discharge: Rate Your Plate Scores:     Nutrition Assessments - 11/19/16 1257      MEDFICTS Scores   Pre Score 29      Nutrition Goals Re-Evaluation:   Nutrition Goals Discharge (Final Nutrition Goals Re-Evaluation):   Psychosocial: Target Goals: Acknowledge presence or absence of significant depression and/or stress, maximize coping skills, provide positive support system. Participant is able to verbalize types and ability to use techniques and skills needed for reducing stress and depression.   Initial Review & Psychosocial Screening:     Initial Psych Review & Screening - 11/19/16 1252      Screening Interventions   Interventions Encouraged to exercise      Quality of Life Scores:      Quality of Life - 11/19/16 1254      Quality of Life Scores   Health/Function Pre 19.07 %   Socioeconomic Pre 21.07 %   Psych/Spiritual Pre 20.21 %   Family Pre 23 %   GLOBAL Pre 20.21 %      PHQ-9: Recent Review Flowsheet Data    Depression screen [facility] 2/9 11/19/2016   Decreased Interest 1   Down, Depressed, Hopeless 1   PHQ -  2 Score 2   Altered sleeping 2   Tired, decreased energy 1   Change in appetite 0   Feeling bad or failure about yourself  0   Trouble concentrating 0   Moving slowly or fidgety/restless 0   Suicidal thoughts 0   PHQ-9 Score 5   Difficult doing work/chores Not difficult at all     Interpretation of Total Score  Total Score Depression Severity:  1-4 = Minimal depression, 5-9 = Mild depression, 10-14 = Moderate depression, 15-19 = Moderately severe depression, 20-27 = Severe depression   Psychosocial  Evaluation and Intervention:   Psychosocial Re-Evaluation:   Psychosocial Discharge (Final Psychosocial Re-Evaluation):   Vocational Rehabilitation: Provide vocational rehab assistance to qualifying candidates.   Vocational Rehab Evaluation & Intervention:     Vocational Rehab - 11/19/16 1243      Initial Vocational Rehab Evaluation & Intervention   Assessment shows need for Vocational Rehabilitation No      Education: Education Goals: Education classes will be provided on a weekly basis, covering required topics. Participant will state understanding/return demonstration of topics presented.  Learning Barriers/Preferences:     Learning Barriers/Preferences - 11/19/16 1242      Learning Barriers/Preferences   Learning Barriers None   Learning Preferences Computer/Internet;Individual Instruction      Education Topics: General Nutrition Guidelines/Fats and Fiber: -Group instruction provided by verbal, written material, models and posters to present the general guidelines for heart healthy nutrition. Gives an explanation and review of dietary fats and fiber.   Controlling Sodium/Reading Food Labels: -Group verbal and written material supporting the discussion of sodium use in heart healthy nutrition. Review and explanation with models, verbal and written materials for utilization of the food label.   Exercise Physiology & Risk Factors: - Group verbal and written instruction with models to review the exercise physiology of the cardiovascular system and associated critical values. Details cardiovascular disease risk factors and the goals associated with each risk factor.   Aerobic Exercise & Resistance Training: - Gives group verbal and written discussion on the health impact of inactivity. On the components of aerobic and resistive training programs and the benefits of this training and how to safely progress through these programs.   Flexibility, Balance, General  Exercise Guidelines: - Provides group verbal and written instruction on the benefits of flexibility and balance training programs. Provides general exercise guidelines with specific guidelines to those with heart or lung disease. Demonstration and skill practice provided.   Stress Management: - Provides group verbal and written instruction about the health risks of elevated stress, cause of high stress, and healthy ways to reduce stress.   Depression: - Provides group verbal and written instruction on the correlation between heart/lung disease and depressed mood, treatment options, and the stigmas associated with seeking treatment.   Anatomy & Physiology of the Heart: - Group verbal and written instruction and models provide basic cardiac anatomy and physiology, with the coronary electrical and arterial systems. Review of: AMI, Angina, Valve disease, Heart Failure, Cardiac Arrhythmia, Pacemakers, and the ICD.   Cardiac Procedures: - Group verbal and written instruction and models to describe the testing methods done to diagnose heart disease. Reviews the outcomes of the test results. Describes the treatment choices: Medical Management, Angioplasty, or Coronary Bypass Surgery.   Cardiac Medications: - Group verbal and written instruction to review commonly prescribed medications for heart disease. Reviews the medication, class of the drug, and side effects. Includes the steps to properly store meds and maintain the  prescription regimen.   Go Sex-Intimacy & Heart Disease, Get SMART - Goal Setting: - Group verbal and written instruction through game format to discuss heart disease and the return to sexual intimacy. Provides group verbal and written material to discuss and apply goal setting through the application of the S.M.A.R.T. Method.   Other Matters of the Heart: - Provides group verbal, written materials and models to describe Heart Failure, Angina, Valve Disease, and Diabetes in the  realm of heart disease. Includes description of the disease process and treatment options available to the cardiac patient.   Exercise & Equipment Safety: - Individual verbal instruction and demonstration of equipment use and safety with use of the equipment.   Cardiac Rehab from 11/19/2016 in [facility] Cardiac and Pulmonary Rehab  Date  11/19/16  Educator  C. EnterkinRN  Instruction Review Code  1- partially meets, needs review/practice      Infection Prevention: - Provides verbal and written material to individual with discussion of infection control including proper hand washing and proper equipment cleaning during exercise session.   Cardiac Rehab from 11/19/2016 in [facility] Cardiac and Pulmonary Rehab  Date  11/19/16  Educator  C. EnterkinRN  Instruction Review Code  2- meets goals/outcomes      Falls Prevention: - Provides verbal and written material to individual with discussion of falls prevention and safety.   Cardiac Rehab from 11/19/2016 in [facility] Cardiac and Pulmonary Rehab  Date  11/19/16  Educator  C. ENterkinRN  Instruction Review Code  2- meets goals/outcomes      Diabetes: - Individual verbal and written instruction to review signs/symptoms of diabetes, desired ranges of glucose level fasting, after meals and with exercise. Advice that pre and post exercise glucose checks will be done for 3 sessions at entry of program.    Knowledge Questionnaire Score:     Knowledge Questionnaire Score - 11/19/16 1242      Knowledge Questionnaire Score   Pre Score 20/28      Core Components/Risk Factors/Patient Goals at Admission:     Personal Goals and Risk Factors at Admission - 11/19/16 1248      Core Components/Risk Factors/Patient Goals on Admission    Weight Management Yes   Intervention Weight Management: Develop a combined nutrition and exercise program designed to reach desired caloric intake, while maintaining appropriate intake of nutrient and fiber, sodium and fats,  and appropriate energy expenditure required for the weight goal.;Weight Management: Provide education and appropriate resources to help participant work on and attain dietary goals.;Weight Management/Obesity: Establish reasonable short term and long term weight goals.   Admit Weight 250 lb (113.4 kg)   Goal Weight: Short Term 245 lb (111.1 kg)   Goal Weight: Long Term 230 lb (104.3 kg)   Expected Outcomes Short Term: Continue to assess and modify interventions until short term weight is achieved;Weight Loss: Understanding of general recommendations for a balanced deficit meal plan, which promotes 1-2 lb weight loss per week and includes a negative energy balance of [phone] kcal/d;Understanding recommendations for meals to include 15-35% energy as protein, 25-35% energy from fat, 35-60% energy from carbohydrates, less than 200mg  of dietary cholesterol, 20-35 gm of total fiber daily   Tobacco Cessation Yes   Number of packs per day Quit smoking 1/4 pack day times 30 years the day he had his heart attack on 10/30/2016   Intervention Offer self-teaching materials, assist with locating and accessing local/national Quit Smoking programs, and support quit date choice.   Expected Outcomes Long Term:  Complete abstinence from all tobacco products for at least 12 months from quit date.;Short Term: Will quit all tobacco product use, adhering to prevention of relapse plan.   Hypertension Yes   Intervention Provide education on lifestyle modifcations including regular physical activity/exercise, weight management, moderate sodium restriction and increased consumption of fresh fruit, vegetables, and low fat dairy, alcohol moderation, and smoking cessation.;Monitor prescription use compliance.   Expected Outcomes Short Term: Continued assessment and intervention until BP is < 140/65mm HG in hypertensive participants. < 130/2mm HG in hypertensive participants with diabetes, heart failure or chronic kidney disease.;Long  Term: Maintenance of blood pressure at goal levels.   Lipids Yes   Intervention Provide education and support for participant on nutrition & aerobic/resistive exercise along with prescribed medications to achieve LDL 70mg , HDL >40mg .   Expected Outcomes Short Term: Participant states understanding of desired cholesterol values and is compliant with medications prescribed. Participant is following exercise prescription and nutrition guidelines.;Long Term: Cholesterol controlled with medications as prescribed, with individualized exercise RX and with personalized nutrition plan. Value goals: LDL < 70mg , HDL > 40 mg.   Stress Yes   Intervention Offer individual and/or small group education and counseling on adjustment to heart disease, stress management and health-related lifestyle change. Teach and support self-help strategies.;Refer participants experiencing significant psychosocial distress to appropriate mental health specialists for further evaluation and treatment. When possible, include family members and significant others in education/counseling sessions.   Expected Outcomes Short Term: Participant demonstrates changes in health-related behavior, relaxation and other stress management skills, ability to obtain effective social support, and compliance with psychotropic medications if prescribed.;Long Term: Emotional wellbeing is indicated by absence of clinically significant psychosocial distress or social isolation.      Core Components/Risk Factors/Patient Goals Review:    Core Components/Risk Factors/Patient Goals at Discharge (Final Review):    ITP Comments:     ITP Comments    Row Name 11/19/16 1244 11/19/16 1250 11/19/16 1252       ITP Comments ITP Created during Medical Review after Cardiac REhab Informed consent was signed Diagnosis was in Discharge Summary 11/04/2016 Quit smoking 1/4 pack cigarettes /day times 30 years the day he had his heart attack on 10/30/2016 Both of his  parents have died. Nelton recently found out that his father when he was 89 had a heart stent placed after he was in a bad car accident. Yamin is concerned and would like to know if his heart is going to get stronger since he knows he has to wear the Lifevest since he was told in the hospital he had a vfib arrest but he doesnt'want an internal defib implatnet (AICD)        Comments: Has a life vest on for history of vfib arrest day after his Heart stents were placed.

## 2016-11-19 NOTE — Patient Instructions (Addendum)
Patient Instructions  Patient Details  Name: William GripSteven J Picariello MRN: [id number] Date of Birth: Jan 06, 1966 Referring Provider:  Marcina MillardParaschos, Alexander, MD  Below are the personal goals you chose as well as exercise and nutrition goals. Our goal is to help you keep on track towards obtaining and maintaining your goals. We will be discussing your progress on these goals with you throughout the program.  Initial Exercise Prescription:     Initial Exercise Prescription - 11/19/16 1400      Date of Initial Exercise RX and Referring Provider   Date 11/19/16   Referring Provider Paraschos, Alexander MD     Treadmill   MPH 3   Grade 1   Minutes 15   METs 3.71     Elliptical   Level 1   Speed 4   Minutes 15     REL-XR   Level 3   Speed 50   Minutes 15   METs 3     Prescription Details   Frequency (times per week) 3   Duration Progress to 45 minutes of aerobic exercise without signs/symptoms of physical distress     Intensity   THRR 40-80% of Max Heartrate 96-145   Ratings of Perceived Exertion 11-13   Perceived Dyspnea 0-4     Progression   Progression Continue to progress workloads to maintain intensity without signs/symptoms of physical distress.     Resistance Training   Training Prescription Yes   Weight 4 lbs   Reps 10-15      Exercise Goals: Frequency: Be able to perform aerobic exercise three times per week working toward 3-5 days per week.  Intensity: Work with a perceived exertion of 11 (fairly light) - 15 (hard) as tolerated. Follow your new exercise prescription and watch for changes in prescription as you progress with the program. Changes will be reviewed with you when they are made.  Duration: You should be able to do 30 minutes of continuous aerobic exercise in addition to a 5 minute warm-up and a 5 minute cool-down routine.  Nutrition Goals: Your personal nutrition goals will be established when you do your nutrition analysis with the dietician.  The  following are nutrition guidelines to follow: Cholesterol < 200mg /day Sodium < 1500mg /day Fiber: Men over 50 yrs - 30 grams per day  Personal Goals:     Personal Goals and Risk Factors at Admission - 11/19/16 1248      Core Components/Risk Factors/Patient Goals on Admission    Weight Management Yes   Intervention Weight Management: Develop a combined nutrition and exercise program designed to reach desired caloric intake, while maintaining appropriate intake of nutrient and fiber, sodium and fats, and appropriate energy expenditure required for the weight goal.;Weight Management: Provide education and appropriate resources to help participant work on and attain dietary goals.;Weight Management/Obesity: Establish reasonable short term and long term weight goals.   Admit Weight 250 lb (113.4 kg)   Goal Weight: Short Term 245 lb (111.1 kg)   Goal Weight: Long Term 230 lb (104.3 kg)   Expected Outcomes Short Term: Continue to assess and modify interventions until short term weight is achieved;Weight Loss: Understanding of general recommendations for a balanced deficit meal plan, which promotes 1-2 lb weight loss per week and includes a negative energy balance of [phone] kcal/d;Understanding recommendations for meals to include 15-35% energy as protein, 25-35% energy from fat, 35-60% energy from carbohydrates, less than 200mg  of dietary cholesterol, 20-35 gm of total fiber daily   Tobacco Cessation Yes  Number of packs per day Quit smoking 1/4 pack day times 30 years the day he had his heart attack on 10/30/2016   Intervention Offer self-teaching materials, assist with locating and accessing local/national Quit Smoking programs, and support quit date choice.   Expected Outcomes Long Term: Complete abstinence from all tobacco products for at least 12 months from quit date.;Short Term: Will quit all tobacco product use, adhering to prevention of relapse plan.   Hypertension Yes   Intervention Provide  education on lifestyle modifcations including regular physical activity/exercise, weight management, moderate sodium restriction and increased consumption of fresh fruit, vegetables, and low fat dairy, alcohol moderation, and smoking cessation.;Monitor prescription use compliance.   Expected Outcomes Short Term: Continued assessment and intervention until BP is < 140/73mm HG in hypertensive participants. < 130/69mm HG in hypertensive participants with diabetes, heart failure or chronic kidney disease.;Long Term: Maintenance of blood pressure at goal levels.   Lipids Yes   Intervention Provide education and support for participant on nutrition & aerobic/resistive exercise along with prescribed medications to achieve LDL 70mg , HDL >40mg .   Expected Outcomes Short Term: Participant states understanding of desired cholesterol values and is compliant with medications prescribed. Participant is following exercise prescription and nutrition guidelines.;Long Term: Cholesterol controlled with medications as prescribed, with individualized exercise RX and with personalized nutrition plan. Value goals: LDL < 70mg , HDL > 40 mg.   Stress Yes   Intervention Offer individual and/or small group education and counseling on adjustment to heart disease, stress management and health-related lifestyle change. Teach and support self-help strategies.;Refer participants experiencing significant psychosocial distress to appropriate mental health specialists for further evaluation and treatment. When possible, include family members and significant others in education/counseling sessions.   Expected Outcomes Short Term: Participant demonstrates changes in health-related behavior, relaxation and other stress management skills, ability to obtain effective social support, and compliance with psychotropic medications if prescribed.;Long Term: Emotional wellbeing is indicated by absence of clinically significant psychosocial distress or  social isolation.      Tobacco Use Initial Evaluation: History  Smoking Status  . Former Smoker  . Packs/day: 0.50  . Years: 30.00  . Quit date: 10/30/2016  Smokeless Tobacco  . Former Neurosurgeon    Comment: 1/2 ppd    Copy of goals given to participant.

## 2016-11-21 ENCOUNTER — Encounter: Payer: 59 | Admitting: *Deleted

## 2016-11-21 ENCOUNTER — Encounter: Payer: Self-pay | Admitting: *Deleted

## 2016-11-21 DIAGNOSIS — Z955 Presence of coronary angioplasty implant and graft: Secondary | ICD-10-CM

## 2016-11-21 DIAGNOSIS — I213 ST elevation (STEMI) myocardial infarction of unspecified site: Secondary | ICD-10-CM

## 2016-11-21 NOTE — Progress Notes (Signed)
Daily Session Note  Patient Details  Name: William Bates MRN: [id number] Date of Birth: 02-Feb-1966 Referring Provider:     Cardiac Rehab from 11/19/2016 in [facility]  Referring Provider  Isaias Cowman MD      Encounter Date: 11/21/2016  Check In:     Session Check In - 11/21/16 0846      Check-In   Location [facility]-Cardiac [facility] Pulmonary Re[facility]   Staff Present Alberteen Sam, MA, ACSM RCEP, Exercise Physiologist;Susanne Bice, RN, BSN, Lance Sell, BA, ACSM CEP, Exercise Physiologist   Supervising physician immediately available to respond to emergencies See telemetry face sheet for immediately available ER MD   Medication changes reported     No   Fall or balance concerns reported    No   Warm-up and Cool-down Performed on first and last piece of equipment   Resistance Training Performed Yes   VAD Patient? No     Pain Assessment   Currently in Pain? No/denies   Multiple Pain Sites No         History  Smoking Status  . Former Smoker  . Packs/day: 0.50  . Years: 30.00  . Quit date: 10/30/2016  Smokeless Tobacco  . Former Systems developer    Comment: 1/2 ppd    Goals Met:  Exercise tolerated well Personal goals reviewed No report of cardiac concerns or symptoms Strength training completed today  Goals Unmet:  Not Applicable  Comments: First full day of exercise!  Patient was oriented to gym and equipment including functions, settings, policies, and procedures.  Patient's individual exercise prescription and treatment plan were reviewed.  All starting workloads were established based on the results of the 6 minute walk test done at initial orientation visit.  The plan for exercise progression was also introduced and progression will be customized based on patient's performance and goals.    Dr. Emily Filbert is Medical Director for [name].

## 2016-11-21 NOTE — Progress Notes (Signed)
Cardiac Individual Treatment Plan  Patient Details  Name: William Bates MRN: [id number] Date of Birth: [birth year] Referring Provider:     Cardiac Rehab from 11/19/2016 in [facility] Cardiac and Pulmonary Rehab  Referring Provider  Marcina Millard MD      Initial Encounter Date:    Cardiac Rehab from 11/19/2016 in [facility] Cardiac and Pulmonary Rehab  Date  11/19/16  Referring Provider  Marcina Millard MD      Visit Diagnosis: ST elevation myocardial infarction (STEMI), unspecified artery [facility])  Status post coronary artery stent placement  Patient's Home Medications on Admission:  Current Outpatient Prescriptions:  .  acetaminophen (TYLENOL) 500 MG tablet, Take 500 mg by mouth every 6 (six) hours as needed., Disp: , Rfl:  .  aspirin 81 MG chewable tablet, Chew 1 tablet (81 mg total) by mouth daily., Disp: , Rfl:  .  atorvastatin (LIPITOR) 40 MG tablet, Take 1 tablet (40 mg total) by mouth daily at 6 PM., Disp: 30 tablet, Rfl: 0 .  lisinopril (PRINIVIL,ZESTRIL) 2.5 MG tablet, Take 1 tablet (2.5 mg total) by mouth daily., Disp: 30 tablet, Rfl: 0 .  metoprolol succinate (TOPROL-XL) 25 MG 24 hr tablet, Take by mouth., Disp: , Rfl:  .  naproxen sodium (ANAPROX) 220 MG tablet, Take 220 mg by mouth 2 (two) times daily with a meal., Disp: , Rfl:  .  ticagrelor (BRILINTA) 90 MG TABS tablet, Take 1 tablet (90 mg total) by mouth 2 (two) times daily., Disp: 60 tablet, Rfl: 0  Past Medical History: Past Medical History:  Diagnosis Date  . Alcohol abuse   . Pre-diabetes   . Tobacco use     Tobacco Use: History  Smoking Status  . Former Smoker  . Packs/day: 0.50  . Years: 30.00  . Quit date: 10/30/2016  Smokeless Tobacco  . Former Neurosurgeon    Comment: 1/2 ppd    Labs: Recent Airline pilot for ITP Cardiac and Pulmonary Rehab Latest Ref Rng & Units 10/30/2016 10/31/2016 10/31/2016   Cholestrol 0 - 200 mg/dL 132(G) 401 -   LDLCALC 0 - 99 mg/dL 027(O) 536(U) -   HDL >44  mg/dL 03(K) 74(Q) -   Trlycerides <150 mg/dL 595(G) 387 564   Hemoglobin A1c 4.8 - 5.6 % 6.0(H) - -       Exercise Target Goals:    Exercise Program Goal: Individual exercise prescription set with THRR, safety & activity barriers. Participant demonstrates ability to understand and report RPE using BORG scale, to self-measure pulse accurately, and to acknowledge the importance of the exercise prescription.  Exercise Prescription Goal: Starting with aerobic activity 30 plus minutes a day, 3 days per week for initial exercise prescription. Provide home exercise prescription and guidelines that participant acknowledges understanding prior to discharge.  Activity Barriers & Risk Stratification:     Activity Barriers & Cardiac Risk Stratification - 11/19/16 1247      Activity Barriers & Cardiac Risk Stratification   Activity Barriers Arthritis;Joint Problems;Deconditioning;Muscular Weakness;Decreased Ventricular Function  multiple injuries to r knee, meniscus tears and ACL tears   Cardiac Risk Stratification High      6 Minute Walk:     6 Minute Walk    Row Name 11/19/16 1424         6 Minute Walk   Phase Initial     Distance 1545 feet     Walk Time 6 minutes     # of Rest Breaks 0  MPH 2.93     METS 4.09     RPE 7     VO2 Peak 14.31     Symptoms No     Resting HR 45 bpm     Resting BP 112/64     Max Ex. HR 77 bpm     Max Ex. BP 164/66     2 Minute Post BP 128/70        Oxygen Initial Assessment:   Oxygen Re-Evaluation:   Oxygen Discharge (Final Oxygen Re-Evaluation):   Initial Exercise Prescription:     Initial Exercise Prescription - 11/19/16 1400      Date of Initial Exercise RX and Referring Provider   Date 11/19/16   Referring Provider Paraschos, Alexander MD     Treadmill   MPH 3   Grade 1   Minutes 15   METs 3.71     Elliptical   Level 1   Speed 4   Minutes 15     REL-XR   Level 3   Speed 50   Minutes 15   METs 3      Prescription Details   Frequency (times per week) 3   Duration Progress to 45 minutes of aerobic exercise without signs/symptoms of physical distress     Intensity   THRR 40-80% of Max Heartrate 96-145   Ratings of Perceived Exertion 11-13   Perceived Dyspnea 0-4     Progression   Progression Continue to progress workloads to maintain intensity without signs/symptoms of physical distress.     Resistance Training   Training Prescription Yes   Weight 4 lbs   Reps 10-15      Perform Capillary Blood Glucose checks as needed.  Exercise Prescription Changes:     Exercise Prescription Changes    Row Name 11/19/16 1400             Response to Exercise   Blood Pressure (Admit) 112/64       Blood Pressure (Exercise) 164/66       Blood Pressure (Exit) 128/70       Heart Rate (Admit) 45 bpm       Heart Rate (Exercise) 77 bpm       Heart Rate (Exit) 47 bpm       Oxygen Saturation (Admit) 99 %       Oxygen Saturation (Exercise) 100 %       Rating of Perceived Exertion (Exercise) 7       Symptoms none       Comments walk test results          Exercise Comments:   Exercise Goals and Review:     Exercise Goals    Row Name 11/19/16 1427             Exercise Goals   Increase Physical Activity Yes       Intervention Provide advice, education, support and counseling about physical activity/exercise needs.;Develop an individualized exercise prescription for aerobic and resistive training based on initial evaluation findings, risk stratification, comorbidities and participant's personal goals.       Expected Outcomes Achievement of increased cardiorespiratory fitness and enhanced flexibility, muscular endurance and strength shown through measurements of functional capacity and personal statement of participant.       Increase Strength and Stamina Yes       Intervention Provide advice, education, support and counseling about physical activity/exercise needs.;Develop an  individualized exercise prescription for aerobic and resistive training based on initial evaluation findings, risk  stratification, comorbidities and participant's personal goals.       Expected Outcomes Achievement of increased cardiorespiratory fitness and enhanced flexibility, muscular endurance and strength shown through measurements of functional capacity and personal statement of participant.          Exercise Goals Re-Evaluation :   Discharge Exercise Prescription (Final Exercise Prescription Changes):     Exercise Prescription Changes - 11/19/16 1400      Response to Exercise   Blood Pressure (Admit) 112/64   Blood Pressure (Exercise) 164/66   Blood Pressure (Exit) 128/70   Heart Rate (Admit) 45 bpm   Heart Rate (Exercise) 77 bpm   Heart Rate (Exit) 47 bpm   Oxygen Saturation (Admit) 99 %   Oxygen Saturation (Exercise) 100 %   Rating of Perceived Exertion (Exercise) 7   Symptoms none   Comments walk test results      Nutrition:  Target Goals: Understanding of nutrition guidelines, daily intake of sodium 1500mg , cholesterol 200mg , calories 30% from fat and 7% or less from saturated fats, daily to have 5 or more servings of fruits and vegetables.  Biometrics:     Pre Biometrics - 11/19/16 1427      Pre Biometrics   Height 6' 0.9" (1.852 m)   Weight 250 lb 4.8 oz (113.5 kg)   Waist Circumference 41 inches   Hip Circumference 42 inches   Waist to Hip Ratio 0.98 %   BMI (Calculated) 33.2   Single Leg Stand 30 seconds       Nutrition Therapy Plan and Nutrition Goals:     Nutrition Therapy & Goals - 11/19/16 1247      Nutrition Therapy   RD appointment defered Yes   Drug/Food Interactions Statins/Certain Fruits     Personal Nutrition Goals   Comments Sally states he is now eating healthier like veggie burgers, drinking alot of water, eating more vegetables. Cutting out fatty foods.     Intervention Plan   Intervention Prescribe, educate and counsel  regarding individualized specific dietary modifications aiming towards targeted core components such as weight, hypertension, lipid management, diabetes, heart failure and other comorbidities.   Expected Outcomes Short Term Goal: Understand basic principles of dietary content, such as calories, fat, sodium, cholesterol and nutrients.      Nutrition Discharge: Rate Your Plate Scores:     Nutrition Assessments - 11/19/16 1257      MEDFICTS Scores   Pre Score 29      Nutrition Goals Re-Evaluation:   Nutrition Goals Discharge (Final Nutrition Goals Re-Evaluation):   Psychosocial: Target Goals: Acknowledge presence or absence of significant depression and/or stress, maximize coping skills, provide positive support system. Participant is able to verbalize types and ability to use techniques and skills needed for reducing stress and depression.   Initial Review & Psychosocial Screening:     Initial Psych Review & Screening - 11/19/16 1252      Screening Interventions   Interventions Encouraged to exercise      Quality of Life Scores:      Quality of Life - 11/19/16 1254      Quality of Life Scores   Health/Function Pre 19.07 %   Socioeconomic Pre 21.07 %   Psych/Spiritual Pre 20.21 %   Family Pre 23 %   GLOBAL Pre 20.21 %      PHQ-9: Recent Review Flowsheet Data    Depression screen [facility] 2/9 11/19/2016   Decreased Interest 1   Down, Depressed, Hopeless 1   PHQ - 2  Score 2   Altered sleeping 2   Tired, decreased energy 1   Change in appetite 0   Feeling bad or failure about yourself  0   Trouble concentrating 0   Moving slowly or fidgety/restless 0   Suicidal thoughts 0   PHQ-9 Score 5   Difficult doing work/chores Not difficult at all     Interpretation of Total Score  Total Score Depression Severity:  1-4 = Minimal depression, 5-9 = Mild depression, 10-14 = Moderate depression, 15-19 = Moderately severe depression, 20-27 = Severe depression   Psychosocial  Evaluation and Intervention:   Psychosocial Re-Evaluation:   Psychosocial Discharge (Final Psychosocial Re-Evaluation):   Vocational Rehabilitation: Provide vocational rehab assistance to qualifying candidates.   Vocational Rehab Evaluation & Intervention:     Vocational Rehab - 11/19/16 1243      Initial Vocational Rehab Evaluation & Intervention   Assessment shows need for Vocational Rehabilitation No      Education: Education Goals: Education classes will be provided on a weekly basis, covering required topics. Participant will state understanding/return demonstration of topics presented.  Learning Barriers/Preferences:     Learning Barriers/Preferences - 11/19/16 1242      Learning Barriers/Preferences   Learning Barriers None   Learning Preferences Computer/Internet;Individual Instruction      Education Topics: General Nutrition Guidelines/Fats and Fiber: -Group instruction provided by verbal, written material, models and posters to present the general guidelines for heart healthy nutrition. Gives an explanation and review of dietary fats and fiber.   Controlling Sodium/Reading Food Labels: -Group verbal and written material supporting the discussion of sodium use in heart healthy nutrition. Review and explanation with models, verbal and written materials for utilization of the food label.   Exercise Physiology & Risk Factors: - Group verbal and written instruction with models to review the exercise physiology of the cardiovascular system and associated critical values. Details cardiovascular disease risk factors and the goals associated with each risk factor.   Aerobic Exercise & Resistance Training: - Gives group verbal and written discussion on the health impact of inactivity. On the components of aerobic and resistive training programs and the benefits of this training and how to safely progress through these programs.   Flexibility, Balance, General  Exercise Guidelines: - Provides group verbal and written instruction on the benefits of flexibility and balance training programs. Provides general exercise guidelines with specific guidelines to those with heart or lung disease. Demonstration and skill practice provided.   Stress Management: - Provides group verbal and written instruction about the health risks of elevated stress, cause of high stress, and healthy ways to reduce stress.   Depression: - Provides group verbal and written instruction on the correlation between heart/lung disease and depressed mood, treatment options, and the stigmas associated with seeking treatment.   Anatomy & Physiology of the Heart: - Group verbal and written instruction and models provide basic cardiac anatomy and physiology, with the coronary electrical and arterial systems. Review of: AMI, Angina, Valve disease, Heart Failure, Cardiac Arrhythmia, Pacemakers, and the ICD.   Cardiac Procedures: - Group verbal and written instruction and models to describe the testing methods done to diagnose heart disease. Reviews the outcomes of the test results. Describes the treatment choices: Medical Management, Angioplasty, or Coronary Bypass Surgery.   Cardiac Medications: - Group verbal and written instruction to review commonly prescribed medications for heart disease. Reviews the medication, class of the drug, and side effects. Includes the steps to properly store meds and maintain the prescription  regimen.   Go Sex-Intimacy & Heart Disease, Get SMART - Goal Setting: - Group verbal and written instruction through game format to discuss heart disease and the return to sexual intimacy. Provides group verbal and written material to discuss and apply goal setting through the application of the S.M.A.R.T. Method.   Other Matters of the Heart: - Provides group verbal, written materials and models to describe Heart Failure, Angina, Valve Disease, and Diabetes in the  realm of heart disease. Includes description of the disease process and treatment options available to the cardiac patient.   Exercise & Equipment Safety: - Individual verbal instruction and demonstration of equipment use and safety with use of the equipment.   Cardiac Rehab from 11/19/2016 in [facility] Cardiac and Pulmonary Rehab  Date  11/19/16  Educator  C. EnterkinRN  Instruction Review Code  1- partially meets, needs review/practice      Infection Prevention: - Provides verbal and written material to individual with discussion of infection control including proper hand washing and proper equipment cleaning during exercise session.   Cardiac Rehab from 11/19/2016 in [facility] Cardiac and Pulmonary Rehab  Date  11/19/16  Educator  C. EnterkinRN  Instruction Review Code  2- meets goals/outcomes      Falls Prevention: - Provides verbal and written material to individual with discussion of falls prevention and safety.   Cardiac Rehab from 11/19/2016 in [facility] Cardiac and Pulmonary Rehab  Date  11/19/16  Educator  C. ENterkinRN  Instruction Review Code  2- meets goals/outcomes      Diabetes: - Individual verbal and written instruction to review signs/symptoms of diabetes, desired ranges of glucose level fasting, after meals and with exercise. Advice that pre and post exercise glucose checks will be done for 3 sessions at entry of program.    Knowledge Questionnaire Score:     Knowledge Questionnaire Score - 11/19/16 1242      Knowledge Questionnaire Score   Pre Score 20/28      Core Components/Risk Factors/Patient Goals at Admission:     Personal Goals and Risk Factors at Admission - 11/19/16 1248      Core Components/Risk Factors/Patient Goals on Admission    Weight Management Yes   Intervention Weight Management: Develop a combined nutrition and exercise program designed to reach desired caloric intake, while maintaining appropriate intake of nutrient and fiber, sodium and fats,  and appropriate energy expenditure required for the weight goal.;Weight Management: Provide education and appropriate resources to help participant work on and attain dietary goals.;Weight Management/Obesity: Establish reasonable short term and long term weight goals.   Admit Weight 250 lb (113.4 kg)   Goal Weight: Short Term 245 lb (111.1 kg)   Goal Weight: Long Term 230 lb (104.3 kg)   Expected Outcomes Short Term: Continue to assess and modify interventions until short term weight is achieved;Weight Loss: Understanding of general recommendations for a balanced deficit meal plan, which promotes 1-2 lb weight loss per week and includes a negative energy balance of [phone] kcal/d;Understanding recommendations for meals to include 15-35% energy as protein, 25-35% energy from fat, 35-60% energy from carbohydrates, less than 200mg  of dietary cholesterol, 20-35 gm of total fiber daily   Tobacco Cessation Yes   Number of packs per day Quit smoking 1/4 pack day times 30 years the day he had his heart attack on 10/30/2016   Intervention Offer self-teaching materials, assist with locating and accessing local/national Quit Smoking programs, and support quit date choice.   Expected Outcomes Long Term: Complete  abstinence from all tobacco products for at least 12 months from quit date.;Short Term: Will quit all tobacco product use, adhering to prevention of relapse plan.   Hypertension Yes   Intervention Provide education on lifestyle modifcations including regular physical activity/exercise, weight management, moderate sodium restriction and increased consumption of fresh fruit, vegetables, and low fat dairy, alcohol moderation, and smoking cessation.;Monitor prescription use compliance.   Expected Outcomes Short Term: Continued assessment and intervention until BP is < 140/4290mm HG in hypertensive participants. < 130/3480mm HG in hypertensive participants with diabetes, heart failure or chronic kidney disease.;Long  Term: Maintenance of blood pressure at goal levels.   Lipids Yes   Intervention Provide education and support for participant on nutrition & aerobic/resistive exercise along with prescribed medications to achieve LDL 70mg , HDL >40mg .   Expected Outcomes Short Term: Participant states understanding of desired cholesterol values and is compliant with medications prescribed. Participant is following exercise prescription and nutrition guidelines.;Long Term: Cholesterol controlled with medications as prescribed, with individualized exercise RX and with personalized nutrition plan. Value goals: LDL < 70mg , HDL > 40 mg.   Stress Yes   Intervention Offer individual and/or small group education and counseling on adjustment to heart disease, stress management and health-related lifestyle change. Teach and support self-help strategies.;Refer participants experiencing significant psychosocial distress to appropriate mental health specialists for further evaluation and treatment. When possible, include family members and significant others in education/counseling sessions.   Expected Outcomes Short Term: Participant demonstrates changes in health-related behavior, relaxation and other stress management skills, ability to obtain effective social support, and compliance with psychotropic medications if prescribed.;Long Term: Emotional wellbeing is indicated by absence of clinically significant psychosocial distress or social isolation.      Core Components/Risk Factors/Patient Goals Review:    Core Components/Risk Factors/Patient Goals at Discharge (Final Review):    ITP Comments:     ITP Comments    Row Name 11/19/16 1244 11/19/16 1250 11/19/16 1252 11/21/16 0652     ITP Comments ITP Created during Medical Review after Cardiac REhab Informed consent was signed Diagnosis was in Discharge Summary 11/04/2016 Quit smoking 1/4 pack cigarettes /day times 30 years the day he had his heart attack on 10/30/2016 Both  of his parents have died. Viviann SpareSteven recently found out that his father when he was [age] had a heart stent placed after he was in a bad car accident. Viviann SpareSteven is concerned and would like to know if his heart is going to get stronger since he knows he has to wear the Lifevest since he was told in the hospital he had a vfib arrest but he doesnt'want an internal defib implatnet (AICD) 30 day review. Continue with ITP unless directed changes per Medical Director review.  New to program       Comments:

## 2016-11-23 ENCOUNTER — Encounter: Payer: 59 | Admitting: *Deleted

## 2016-11-23 DIAGNOSIS — Z955 Presence of coronary angioplasty implant and graft: Secondary | ICD-10-CM

## 2016-11-23 DIAGNOSIS — I213 ST elevation (STEMI) myocardial infarction of unspecified site: Secondary | ICD-10-CM

## 2016-11-23 NOTE — Progress Notes (Signed)
Daily Session Note  Patient Details  Name: William Bates MRN: [id number] Date of Birth: 1966-06-03 Referring Provider:     Cardiac Rehab from 11/19/2016 in [facility] Cardiac and Pulmonary Rehab  Referring Provider  Isaias Cowman MD      Encounter Date: 11/23/2016  Check In:     Session Check In - 11/23/16 0925      Check-In   Location [facility]-Cardiac & Pulmonary Rehab   Staff Present Alberteen Sam, MA, ACSM RCEP, Exercise Physiologist;Mary Kellie Shropshire, RN, BSN, MA;Susanne Bice, RN, BSN, CCRP;Joseph Flavia Shipper   Supervising physician immediately available to respond to emergencies See telemetry face sheet for immediately available ER MD   Medication changes reported     No   Fall or balance concerns reported    No   Warm-up and Cool-down Performed as group-led instruction   Resistance Training Performed Yes   VAD Patient? No     Pain Assessment   Currently in Pain? No/denies   Multiple Pain Sites No         History  Smoking Status  . Former Smoker  . Packs/day: 0.50  . Years: 30.00  . Quit date: 10/30/2016  Smokeless Tobacco  . Former Systems developer    Comment: 1/2 ppd    Goals Met:  Independence with exercise equipment Exercise tolerated well No report of cardiac concerns or symptoms Strength training completed today  Goals Unmet:  Not Applicable  Comments: Pt able to follow exercise prescription today without complaint.  Will continue to monitor for progression. Reviewed home exercise with pt today.  Pt plans to walk and go to [employer] with his wife for exercise.  Reviewed THR, pulse, RPE, sign and symptoms, and when to call 911 or MD.  Also discussed weather considerations and indoor options.  Pt voiced understanding.    Dr. Emily Filbert is Medical Director for [place] and [name].

## 2016-11-26 ENCOUNTER — Encounter: Payer: 59 | Admitting: *Deleted

## 2016-11-26 DIAGNOSIS — Z955 Presence of coronary angioplasty implant and graft: Secondary | ICD-10-CM | POA: Diagnosis not present

## 2016-11-26 DIAGNOSIS — I213 ST elevation (STEMI) myocardial infarction of unspecified site: Secondary | ICD-10-CM

## 2016-11-26 NOTE — Progress Notes (Signed)
Daily Session Note  Patient Details  Name: William Bates MRN: [id number] Date of Birth: 23-Feb-1966 Referring Provider:     Cardiac Rehab from 11/19/2016 in [facility] Cardiac and Pulmonary Rehab  Referring Provider  Isaias Cowman MD      Encounter Date: 11/26/2016  Check In:     Session Check In - 11/26/16 0844      Check-In   Location [facility]-Cardiac & Pulmonary Rehab   Staff Present Gerlene Burdock, RN, Levie Heritage, MA, ACSM RCEP, Exercise Physiologist;Kelly Amedeo Plenty, BS, ACSM CEP, Exercise Physiologist   Supervising physician immediately available to respond to emergencies See telemetry face sheet for immediately available ER MD   Medication changes reported     No   Fall or balance concerns reported    No   Warm-up and Cool-down Performed on first and last piece of equipment   Resistance Training Performed Yes   VAD Patient? No     Pain Assessment   Currently in Pain? No/denies   Multiple Pain Sites No         History  Smoking Status  . Former Smoker  . Packs/day: 0.50  . Years: 30.00  . Quit date: 10/30/2016  Smokeless Tobacco  . Former Systems developer    Comment: 1/2 ppd    Goals Met:  Independence with exercise equipment Exercise tolerated well No report of cardiac concerns or symptoms Strength training completed today  Goals Unmet:  Not Applicable  Comments: Pt able to follow exercise prescription today without complaint.  Will continue to monitor for progression.    Dr. Emily Filbert is Medical Director for [place] and [name].

## 2016-11-28 ENCOUNTER — Encounter: Payer: 59 | Admitting: *Deleted

## 2016-11-28 DIAGNOSIS — I213 ST elevation (STEMI) myocardial infarction of unspecified site: Secondary | ICD-10-CM

## 2016-11-28 DIAGNOSIS — Z955 Presence of coronary angioplasty implant and graft: Secondary | ICD-10-CM

## 2016-11-28 NOTE — Progress Notes (Signed)
Daily Session Note  Patient Details  Name: AGOSTINO GORIN MRN: [id number] Date of Birth: [birth year] Referring Provider:     Cardiac Rehab from 11/19/2016 in [facility] Cardiac and Pulmonary Rehab  Referring Provider  Isaias Cowman MD      Encounter Date: 11/28/2016  Check In:     Session Check In - 11/28/16 0852      Check-In   Location [facility]-Cardiac & Pulmonary Rehab   Staff Present Heath Lark, RN, BSN, CCRP;Jessica Luan Pulling, MA, ACSM RCEP, Exercise Physiologist;Krista Frederico Hamman, RN BSN;Caeson Filippi Joya Gaskins RN,BSN   Supervising physician immediately available to respond to emergencies See telemetry face sheet for immediately available ER MD   Medication changes reported     No   Fall or balance concerns reported    No   Warm-up and Cool-down Performed on first and last piece of equipment   Resistance Training Performed Yes   VAD Patient? No     Pain Assessment   Currently in Pain? No/denies   Multiple Pain Sites No           Exercise Prescription Changes - 11/27/16 1200      Response to Exercise   Blood Pressure (Admit) 102/62   Blood Pressure (Exercise) 124/74   Blood Pressure (Exit) 128/70   Heart Rate (Admit) 61 bpm   Heart Rate (Exercise) 109 bpm   Heart Rate (Exit) 58 bpm   Rating of Perceived Exertion (Exercise) 12   Symptoms none   Duration Continue with 45 min of aerobic exercise without signs/symptoms of physical distress.   Intensity THRR unchanged     Progression   Progression Continue to progress workloads to maintain intensity without signs/symptoms of physical distress.   Average METs 5.82     Resistance Training   Training Prescription Yes   Weight 4 lbs   Reps 10-15     Interval Training   Interval Training No     Treadmill   MPH 3.5   Grade 2   Minutes 15   METs 4.64     Elliptical   Level 1   Speed 5.6   Minutes 15     REL-XR   Level 10   Minutes 15   METs 7     Home Exercise Plan   Plans to continue exercise at [facility] (comment)  Full Time Fitness   Frequency Add 3 additional days to program exercise sessions.   Initial Home Exercises Provided 11/23/16      History  Smoking Status  . Former Smoker  . Packs/day: 0.50  . Years: 30.00  . Quit date: 10/30/2016  Smokeless Tobacco  . Former Systems developer    Comment: 1/2 ppd    Goals Met:  Independence with exercise equipment Exercise tolerated well No report of cardiac concerns or symptoms Strength training completed today  Goals Unmet:  Not Applicable  Comments: Pt able to follow exercise prescription today without complaint.  Will continue to monitor for progression.    Dr. Emily Filbert is Medical Director for [place] and [name].

## 2016-11-30 ENCOUNTER — Encounter: Payer: 59 | Admitting: *Deleted

## 2016-11-30 DIAGNOSIS — Z955 Presence of coronary angioplasty implant and graft: Secondary | ICD-10-CM

## 2016-11-30 DIAGNOSIS — I213 ST elevation (STEMI) myocardial infarction of unspecified site: Secondary | ICD-10-CM

## 2016-11-30 NOTE — Progress Notes (Signed)
Daily Session Note  Patient Details  Name: [name] MRN: [id number] Date of Birth: Sep 16, 1965 Referring Provider:     Cardiac Rehab from 11/19/2016 in [facility] Cardiac and Pulmonary Rehab  Referring Provider  Isaias Cowman MD      Encounter Date: 11/30/2016  Check In:     Session Check In - 11/30/16 0913      Check-In   Location [facility]-Cardiac & Pulmonary Rehab   Staff Present Heath Lark, RN, BSN, CCRP;Jessica Luan Pulling, MA, ACSM RCEP, Exercise Physiologist;Cheridan Kibler, RN, BSN   Supervising physician immediately available to respond to emergencies See telemetry face sheet for immediately available ER MD   Medication changes reported     No   Fall or balance concerns reported    No   Tobacco Cessation No Change   Warm-up and Cool-down Performed on first and last piece of equipment   Resistance Training Performed Yes   VAD Patient? No     Pain Assessment   Currently in Pain? No/denies         History  Smoking Status  . Former Smoker  . Packs/day: 0.50  . Years: 30.00  . Quit date: 10/30/2016  Smokeless Tobacco  . Former Systems developer    Comment: 1/2 ppd    Goals Met:  Proper associated with RPD/PD & O2 Sat Exercise tolerated well No report of cardiac concerns or symptoms Strength training completed today  Goals Unmet:  Not Applicable  Comments:    Dr. Emily Filbert is Medical Director for [place] and [name].

## 2016-12-03 ENCOUNTER — Encounter: Payer: 59 | Attending: Cardiology | Admitting: *Deleted

## 2016-12-03 DIAGNOSIS — Z955 Presence of coronary angioplasty implant and graft: Secondary | ICD-10-CM | POA: Diagnosis not present

## 2016-12-03 DIAGNOSIS — I213 ST elevation (STEMI) myocardial infarction of unspecified site: Secondary | ICD-10-CM | POA: Diagnosis not present

## 2016-12-03 NOTE — Progress Notes (Signed)
Daily Session Note  Patient Details  Name: GARIK DIAMANT MRN: [id number] Date of Birth: 02/11/1966 Referring Provider:     Cardiac Rehab from 11/19/2016 in [facility] Cardiac and Pulmonary Rehab  Referring Provider  Isaias Cowman MD      Encounter Date: 12/03/2016  Check In:     Session Check In - 12/03/16 0838      Check-In   Location [facility]-Cardiac & Pulmonary Rehab   Staff Present Alberteen Sam, MA, ACSM RCEP, Exercise Physiologist;Kelly Amedeo Plenty, BS, ACSM CEP, Exercise Physiologist;Carroll Enterkin, RN, BSN   Supervising physician immediately available to respond to emergencies See telemetry face sheet for immediately available ER MD   Medication changes reported     No   Fall or balance concerns reported    No   Warm-up and Cool-down Performed as group-led instruction   Resistance Training Performed Yes   VAD Patient? No     Pain Assessment   Currently in Pain? No/denies   Multiple Pain Sites No         History  Smoking Status  . Former Smoker  . Packs/day: 0.50  . Years: 30.00  . Quit date: 10/30/2016  Smokeless Tobacco  . Former Systems developer    Comment: 1/2 ppd    Goals Met:  Independence with exercise equipment Exercise tolerated well No report of cardiac concerns or symptoms Strength training completed today  Goals Unmet:  Not Applicable  Comments: Pt able to follow exercise prescription today without complaint.  Will continue to monitor for progression.    Dr. Emily Filbert is Medical Director for [place] and [name].

## 2016-12-07 ENCOUNTER — Encounter: Payer: 59 | Admitting: *Deleted

## 2016-12-07 DIAGNOSIS — I213 ST elevation (STEMI) myocardial infarction of unspecified site: Secondary | ICD-10-CM

## 2016-12-07 DIAGNOSIS — Z955 Presence of coronary angioplasty implant and graft: Secondary | ICD-10-CM | POA: Diagnosis not present

## 2016-12-07 NOTE — Progress Notes (Signed)
Daily Session Note  Patient Details  Name: BRECCAN GALANT MRN: [id number] Date of Birth: [birth year] Referring Provider:     Cardiac Rehab from 11/19/2016 in [facility] Cardiac and Pulmonary Rehab  Referring Provider  Isaias Cowman MD      Encounter Date: 12/07/2016  Check In:     Session Check In - 12/07/16 0844      Check-In   Location [facility]-Cardiac & Pulmonary Rehab   Staff Present Alberteen Sam, MA, ACSM RCEP, Exercise Physiologist;Amanda Oletta Darter, BA, ACSM CEP, Exercise Physiologist;Carroll Enterkin, RN, BSN   Supervising physician immediately available to respond to emergencies See telemetry face sheet for immediately available ER MD   Medication changes reported     No   Fall or balance concerns reported    No   Warm-up and Cool-down Performed on first and last piece of equipment   Resistance Training Performed Yes   VAD Patient? No     Pain Assessment   Currently in Pain? No/denies   Multiple Pain Sites No         History  Smoking Status  . Former Smoker  . Packs/day: 0.50  . Years: 30.00  . Quit date: 10/30/2016  Smokeless Tobacco  . Former Systems developer    Comment: 1/2 ppd    Goals Met:  Independence with exercise equipment Exercise tolerated well Personal goals reviewed No report of cardiac concerns or symptoms Strength training completed today  Goals Unmet:  Not Applicable  Comments: Pt able to follow exercise prescription today without complaint.  Will continue to monitor for progression.    Dr. Emily Filbert is Medical Director for [place] and [name].

## 2016-12-10 ENCOUNTER — Encounter: Payer: 59 | Admitting: *Deleted

## 2016-12-10 DIAGNOSIS — Z955 Presence of coronary angioplasty implant and graft: Secondary | ICD-10-CM | POA: Diagnosis not present

## 2016-12-10 DIAGNOSIS — I213 ST elevation (STEMI) myocardial infarction of unspecified site: Secondary | ICD-10-CM

## 2016-12-10 NOTE — Progress Notes (Signed)
Daily Session Note  Patient Details  Name: TREYSEN SUDBECK MRN: [id number] Date of Birth: [birth year] Referring Provider:     Cardiac Rehab from 11/19/2016 in [facility] Cardiac and Pulmonary Rehab  Referring Provider  Isaias Cowman MD      Encounter Date: 12/10/2016  Check In:     Session Check In - 12/10/16 0848      Check-In   Location [facility]-Cardiac & Pulmonary Rehab   Staff Present Gerlene Burdock, RN, Moises Blood, BS, ACSM CEP, Exercise Physiologist;Krista Frederico Hamman, RN BSN   Supervising physician immediately available to respond to emergencies See telemetry face sheet for immediately available ER MD   Medication changes reported     No   Fall or balance concerns reported    No   Tobacco Cessation No Change   Warm-up and Cool-down Performed on first and last piece of equipment   Resistance Training Performed Yes   VAD Patient? No     Pain Assessment   Currently in Pain? No/denies   Multiple Pain Sites No         History  Smoking Status  . Former Smoker  . Packs/day: 0.50  . Years: 30.00  . Quit date: 10/30/2016  Smokeless Tobacco  . Former Systems developer    Comment: 1/2 ppd    Goals Met:  Independence with exercise equipment Exercise tolerated well No report of cardiac concerns or symptoms Strength training completed today  Goals Unmet:  Not Applicable  Comments: Pt able to follow exercise prescription today without complaint.  Will continue to monitor for progression.    Dr. Emily Filbert is Medical Director for [place] and [name].

## 2016-12-12 DIAGNOSIS — Z955 Presence of coronary angioplasty implant and graft: Secondary | ICD-10-CM | POA: Diagnosis not present

## 2016-12-12 DIAGNOSIS — I213 ST elevation (STEMI) myocardial infarction of unspecified site: Secondary | ICD-10-CM

## 2016-12-12 NOTE — Progress Notes (Signed)
Daily Session Note  Patient Details  Name: William Bates MRN: [id number] Date of Birth: [birth year] Referring Provider:     Cardiac Rehab from 11/19/2016 in [facility] Cardiac and Pulmonary Rehab  Referring Provider  Isaias Cowman MD      Encounter Date: 12/12/2016  Check In:     Session Check In - 12/12/16 0803      Check-In   Location [facility]-Cardiac & Pulmonary Rehab   Staff Present Nyoka Cowden, RN, BSN, [facility] RCP,RRT,BSRT;Heath Lark, RN, BSN, CCRP   Supervising physician immediately available to respond to emergencies See telemetry face sheet for immediately available ER MD   Medication changes reported     No   Fall or balance concerns reported    No   Tobacco Cessation No Change   Warm-up and Cool-down Performed on first and last piece of equipment   Resistance Training Performed Yes   VAD Patient? No     Pain Assessment   Currently in Pain? No/denies   Multiple Pain Sites No           Exercise Prescription Changes - 12/11/16 1500      Response to Exercise   Blood Pressure (Exercise) 130/80   Blood Pressure (Exit) 138/60   Heart Rate (Admit) 38 bpm  sent note to Dr about bradycardia   Heart Rate (Exercise) 108 bpm   Heart Rate (Exit) 76 bpm   Rating of Perceived Exertion (Exercise) 13   Symptoms none   Duration Continue with 45 min of aerobic exercise without signs/symptoms of physical distress.   Intensity THRR unchanged     Progression   Progression Continue to progress workloads to maintain intensity without signs/symptoms of physical distress.   Average METs 5.61     Resistance Training   Training Prescription Yes   Weight 4 lb   Reps 10-15     Interval Training   Interval Training No     Treadmill   MPH 3.5   Grade 4   Minutes 15   METs 5.61     REL-XR   Level 15   Minutes 15      History  Smoking Status  . Former Smoker  . Packs/day: 0.50  . Years: 30.00  . Quit date: 10/30/2016  Smokeless Tobacco  . Former  Systems developer    Comment: 1/2 ppd    Goals Met:  Independence with exercise equipment Exercise tolerated well No report of cardiac concerns or symptoms Strength training completed today  Goals Unmet:  Not Applicable  Comments: Pt able to follow exercise prescription today without complaint.  Will continue to monitor for progression.   Dr. Emily Filbert is Medical Director for [place] and [name].

## 2016-12-14 ENCOUNTER — Encounter: Payer: 59 | Admitting: *Deleted

## 2016-12-14 ENCOUNTER — Encounter: Payer: Self-pay | Admitting: *Deleted

## 2016-12-14 DIAGNOSIS — Z955 Presence of coronary angioplasty implant and graft: Secondary | ICD-10-CM

## 2016-12-14 DIAGNOSIS — I213 ST elevation (STEMI) myocardial infarction of unspecified site: Secondary | ICD-10-CM

## 2016-12-14 NOTE — Progress Notes (Signed)
Daily Session Note  Patient Details  Name: William Bates MRN: [id number] Date of Birth: 03/30/1966 Referring Provider:     Cardiac Rehab from 11/19/2016 in [facility] Cardiac and Pulmonary Rehab  Referring Provider  Paraschos, Alexander MD      Encounter Date: 12/14/2016  Check In:     Session Check In - 12/14/16 0915      Check-In   Staff Present Susanne Bice, RN, BSN, CCRP;Mary Jo Abernethy, RN, BSN, MA;Amanda Sommer, BA, ACSM CEP, Exercise Physiologist   Supervising physician immediately available to respond to emergencies See telemetry face sheet for immediately available ER MD   Medication changes reported     No   Fall or balance concerns reported    No   Tobacco Cessation No Change   Warm-up and Cool-down Performed on first and last piece of equipment   Resistance Training Performed Yes   VAD Patient? No     Pain Assessment   Currently in Pain? No/denies         History  Smoking Status  . Former Smoker  . Packs/day: 0.50  . Years: 30.00  . Quit date: 10/30/2016  Smokeless Tobacco  . Former User    Comment: Remains Quit 12/14/2016    Goals Met:  Independence with exercise equipment Exercise tolerated well No report of cardiac concerns or symptoms Strength training completed today  Goals Unmet:  Not Applicable  Comments: Doing well with exercise prescription progression.    Dr. Mark Miller is Medical Director for [facility] and [name]. 

## 2016-12-19 ENCOUNTER — Encounter: Payer: Self-pay | Admitting: *Deleted

## 2016-12-19 DIAGNOSIS — I213 ST elevation (STEMI) myocardial infarction of unspecified site: Secondary | ICD-10-CM

## 2016-12-19 DIAGNOSIS — Z955 Presence of coronary angioplasty implant and graft: Secondary | ICD-10-CM

## 2016-12-19 NOTE — Progress Notes (Signed)
Cardiac Individual Treatment Plan  Patient Details  Name: William Bates MRN: [id number] Date of Birth: Oct 13, 1965 Referring Provider:     Cardiac Rehab from 11/19/2016 in [facility] Cardiac and Pulmonary Rehab  Referring Provider  Isaias Cowman MD      Initial Encounter Date:    Cardiac Rehab from 11/19/2016 in [facility] Cardiac and Pulmonary Rehab  Date  11/19/16  Referring Provider  Isaias Cowman MD      Visit Diagnosis: ST elevation myocardial infarction (STEMI), unspecified artery [facility])  Status post coronary artery stent placement  Patient's Home Medications on Admission:  Current Outpatient Prescriptions:  .  acetaminophen (TYLENOL) 500 MG tablet, Take 500 mg by mouth every 6 (six) hours as needed., Disp: , Rfl:  .  aspirin 81 MG chewable tablet, Chew 1 tablet (81 mg total) by mouth daily., Disp: , Rfl:  .  atorvastatin (LIPITOR) 40 MG tablet, Take 1 tablet (40 mg total) by mouth daily at 6 PM., Disp: 30 tablet, Rfl: 0 .  lisinopril (PRINIVIL,ZESTRIL) 2.5 MG tablet, Take 1 tablet (2.5 mg total) by mouth daily., Disp: 30 tablet, Rfl: 0 .  metoprolol succinate (TOPROL-XL) 25 MG 24 hr tablet, Take by mouth., Disp: , Rfl:  .  naproxen sodium (ANAPROX) 220 MG tablet, Take 220 mg by mouth 2 (two) times daily with a meal., Disp: , Rfl:  .  ticagrelor (BRILINTA) 90 MG TABS tablet, Take 1 tablet (90 mg total) by mouth 2 (two) times daily., Disp: 60 tablet, Rfl: 0  Past Medical History: Past Medical History:  Diagnosis Date  . Alcohol abuse   . Pre-diabetes   . Tobacco use     Tobacco Use: History  Smoking Status  . Former Smoker  . Packs/day: 0.50  . Years: 30.00  . Quit date: 10/30/2016  Smokeless Tobacco  . Former User    Comment: Remains Quit 12/14/2016    Labs: Recent Review Flowsheet Data    Labs for ITP Cardiac and Pulmonary Rehab Latest Ref Rng & Units 10/30/2016 10/31/2016 10/31/2016   Cholestrol 0 - 200 mg/dL 202(H) 160 -   LDLCALC 0 - 99 mg/dL 110(H) 103(H)  -   HDL >40 mg/dL 37(L) 30(L) -   Trlycerides <150 mg/dL 276(H) 136 136   Hemoglobin A1c 4.8 - 5.6 % 6.0(H) - -       Exercise Target Goals:    Exercise Program Goal: Individual exercise prescription set with THRR, safety & activity barriers. Participant demonstrates ability to understand and report RPE using BORG scale, to self-measure pulse accurately, and to acknowledge the importance of the exercise prescription.  Exercise Prescription Goal: Starting with aerobic activity 30 plus minutes a day, 3 days per week for initial exercise prescription. Provide home exercise prescription and guidelines that participant acknowledges understanding prior to discharge.  Activity Barriers & Risk Stratification:     Activity Barriers & Cardiac Risk Stratification - 11/19/16 1247      Activity Barriers & Cardiac Risk Stratification   Activity Barriers Arthritis;Joint Problems;Deconditioning;Muscular Weakness;Decreased Ventricular Function  multiple injuries to r knee, meniscus tears and ACL tears   Cardiac Risk Stratification High      6 Minute Walk:     6 Minute Walk    Row Name 11/19/16 1424         6 Minute Walk   Phase Initial     Distance 1545 feet     Walk Time 6 minutes     # of Rest Breaks 0  MPH 2.93     METS 4.09     RPE 7     VO2 Peak 14.31     Symptoms No     Resting HR 45 bpm     Resting BP 112/64     Max Ex. HR 77 bpm     Max Ex. BP 164/66     2 Minute Post BP 128/70        Oxygen Initial Assessment:   Oxygen Re-Evaluation:   Oxygen Discharge (Final Oxygen Re-Evaluation):   Initial Exercise Prescription:     Initial Exercise Prescription - 11/19/16 1400      Date of Initial Exercise RX and Referring Provider   Date 11/19/16   Referring Provider Paraschos, Alexander MD     Treadmill   MPH 3   Grade 1   Minutes 15   METs 3.71     Elliptical   Level 1   Speed 4   Minutes 15     REL-XR   Level 3   Speed 50   Minutes 15   METs 3      Prescription Details   Frequency (times per week) 3   Duration Progress to 45 minutes of aerobic exercise without signs/symptoms of physical distress     Intensity   THRR 40-80% of Max Heartrate 96-145   Ratings of Perceived Exertion 11-13   Perceived Dyspnea 0-4     Progression   Progression Continue to progress workloads to maintain intensity without signs/symptoms of physical distress.     Resistance Training   Training Prescription Yes   Weight 4 lbs   Reps 10-15      Perform Capillary Blood Glucose checks as needed.  Exercise Prescription Changes:     Exercise Prescription Changes    Row Name 11/19/16 1400 11/23/16 0900 11/27/16 1200 12/11/16 1500       Response to Exercise   Blood Pressure (Admit) 112/64  - 102/62  -    Blood Pressure (Exercise) 164/66  - 124/74 130/80    Blood Pressure (Exit) 128/70  - 128/70 138/60    Heart Rate (Admit) 45 bpm  - 61 bpm 38 bpm  sent note to Dr about bradycardia    Heart Rate (Exercise) 77 bpm  - 109 bpm 108 bpm    Heart Rate (Exit) 47 bpm  - 58 bpm 76 bpm    Oxygen Saturation (Admit) 99 %  -  -  -    Oxygen Saturation (Exercise) 100 %  -  -  -    Rating of Perceived Exertion (Exercise) 7  - 12 13    Symptoms none  - none none    Comments walk test results  -  -  -    Duration  -  - Continue with 45 min of aerobic exercise without signs/symptoms of physical distress. Continue with 45 min of aerobic exercise without signs/symptoms of physical distress.    Intensity  -  - THRR unchanged THRR unchanged      Progression   Progression  -  - Continue to progress workloads to maintain intensity without signs/symptoms of physical distress. Continue to progress workloads to maintain intensity without signs/symptoms of physical distress.    Average METs  -  - 5.82 5.61      Resistance Training   Training Prescription  -  - Yes Yes    Weight  -  - 4 lbs 4 lb    Reps  -  -  10-15 10-15      Interval Training   Interval Training   -  - No No      Treadmill   MPH  -  - 3.5 3.5    Grade  -  - 2 4    Minutes  -  - 15 15    METs  -  - 4.64 5.61      Elliptical   Level  -  - 1  -    Speed  -  - 5.6  -    Minutes  -  - 15  -      REL-XR   Level  -  - 10 15    Minutes  -  - 15 15    METs  -  - 7  -      Home Exercise Plan   Plans to continue exercise at  [facility] (comment)  [employer] (comment)  [employer]  -    Frequency  - Add 3 additional days to program exercise sessions. Add 3 additional days to program exercise sessions.  -    Initial Home Exercises Provided  - 11/23/16 11/23/16  -       Exercise Comments:     Exercise Comments    Row Name 11/21/16 [phone] 11/23/16 0926 11/28/16 0756       Exercise Comments  First full day of exercise!  Patient was oriented to gym and equipment including functions, settings, policies, and procedures.  Patient's individual exercise prescription and treatment plan were reviewed.  All starting workloads were established based on the results of the 6 minute walk test done at initial orientation visit.  The plan for exercise progression was also introduced and progression will be customized based on patient's performance and goals. Reviewed home exercise with pt today.  Pt plans to walk and go to [employer] with his wife for exercise.  Reviewed THR, pulse, RPE, sign and symptoms, and when to call 911 or MD.  Also discussed weather considerations and indoor options.  Pt voiced understanding. Reviewed METs average and discussed progression with pt today.        Exercise Goals and Review:     Exercise Goals    Row Name 11/19/16 1427             Exercise Goals   Increase Physical Activity Yes       Intervention Provide advice, education, support and counseling about physical activity/exercise needs.;Develop an individualized exercise prescription for aerobic and resistive training based on initial evaluation findings,  risk stratification, comorbidities and participant's personal goals.       Expected Outcomes Achievement of increased cardiorespiratory fitness and enhanced flexibility, muscular endurance and strength shown through measurements of functional capacity and personal statement of participant.       Increase Strength and Stamina Yes       Intervention Provide advice, education, support and counseling about physical activity/exercise needs.;Develop an individualized exercise prescription for aerobic and resistive training based on initial evaluation findings, risk stratification, comorbidities and participant's personal goals.       Expected Outcomes Achievement of increased cardiorespiratory fitness and enhanced flexibility, muscular endurance and strength shown through measurements of functional capacity and personal statement of participant.          Exercise Goals Re-Evaluation :     Exercise Goals Re-Evaluation    Row Name 11/23/16 [phone] 11/27/16 1220 12/07/16 0839 12/11/16 1555  Exercise Goal Re-Evaluation   Exercise Goals Review Increase Physical Activity;Increase Strenth and Stamina Increase Physical Activity;Increase Strenth and Stamina Increase Physical Activity;Increase Strenth and Stamina Increase Physical Activity;Increase Strenth and Stamina    Comments Reviewed home exercise with pt today.  Pt plans to walk and go to [employer] with his wife for exercise.  Reviewed THR, pulse, RPE, sign and symptoms, and when to call 911 or MD.  Also discussed weather considerations and indoor options.  Pt voiced understanding. Richardson Landry is off to a good start in rehab.  He really wants to work hard and has requested to do the elliptical for 50mn versus switching to a seated machine.  He is also interested in starting intervals after this week.  We will continue to monitor his progression. SRichardson Landryis feeling better.  He has more strength and stamina and no longer has rounds of shortness of breath.   He is going to the gym 3 days a week and works on the treadmill.  He has also started to add some weights into his routine. SRichardson Landryhas increased incline on Tm and resistance on XR. Staff willc ontinue to monitor progression.    Expected Outcomes Short: Start going back to gym.  Long: Make exercise part of routine. Short: Add in intervals.  Long: Continue to exercise daily. Short: Continue to exercise at home and add different pieces in.  Long: Gain confidence in exercise. Short - attend class on a regular basis Long - Improve overall functional fitness level       Discharge Exercise Prescription (Final Exercise Prescription Changes):     Exercise Prescription Changes - 12/11/16 1500      Response to Exercise   Blood Pressure (Exercise) 130/80   Blood Pressure (Exit) 138/60   Heart Rate (Admit) 38 bpm  sent note to Dr about bradycardia   Heart Rate (Exercise) 108 bpm   Heart Rate (Exit) 76 bpm   Rating of Perceived Exertion (Exercise) 13   Symptoms none   Duration Continue with 45 min of aerobic exercise without signs/symptoms of physical distress.   Intensity THRR unchanged     Progression   Progression Continue to progress workloads to maintain intensity without signs/symptoms of physical distress.   Average METs 5.61     Resistance Training   Training Prescription Yes   Weight 4 lb   Reps 10-15     Interval Training   Interval Training No     Treadmill   MPH 3.5   Grade 4   Minutes 15   METs 5.61     REL-XR   Level 15   Minutes 15      Nutrition:  Target Goals: Understanding of nutrition guidelines, daily intake of sodium <15027m cholesterol <20037mcalories 30% from fat and 7% or less from saturated fats, daily to have 5 or more servings of fruits and vegetables.  Biometrics:     Pre Biometrics - 11/19/16 1427      Pre Biometrics   Height 6' 0.9" (1.852 m)   Weight 250 lb 4.8 oz (113.5 kg)   Waist Circumference 41 inches   Hip Circumference 42 inches    Waist to Hip Ratio 0.98 %   BMI (Calculated) 33.2   Single Leg Stand 30 seconds       Nutrition Therapy Plan and Nutrition Goals:     Nutrition Therapy & Goals - 12/04/16 0835      Nutrition Therapy   Diet Instructed on a meal plan  based on 2000 calories and heart healthy dietary principles.   Drug/Food Interactions Statins/Certain Fruits   Protein (specify units) 8   Fiber 30 grams   Whole Grain Foods 3 servings   Saturated Fats 14 max. grams   Fruits and Vegetables 5 servings/day  8 is optimal   Sodium 2000 grams  1500 is ideal     Personal Nutrition Goals   Nutrition Goal Read labels for saturated and trans fat and sodium.   Personal Goal #2 Balance meals more with at least 2 servings of carbohydrate. Think of food guide plate which  balances carbohydrate, protein and non-starchy vegetables.   Personal Goal #3 Use phone apps such as caloreking.com to look up nutrition informaton especially for foods eaten "out".     Intervention Plan   Intervention Prescribe, educate and counsel regarding individualized specific dietary modifications aiming towards targeted core components such as weight, hypertension, lipid management, diabetes, heart failure and other comorbidities.;Nutrition handout(s) given to patient.   Expected Outcomes Short Term Goal: Understand basic principles of dietary content, such as calories, fat, sodium, cholesterol and nutrients.;Short Term Goal: A plan has been developed with personal nutrition goals set during dietitian appointment.;Long Term Goal: Adherence to prescribed nutrition plan.      Nutrition Discharge: Rate Your Plate Scores:     Nutrition Assessments - 11/19/16 1257      MEDFICTS Scores   Pre Score 29      Nutrition Goals Re-Evaluation:     Nutrition Goals Re-Evaluation    Row Name 12/07/16 0836             Goals   Current Weight 242 lb (109.8 kg)       Nutrition Goal Read labels for saturated and trans fat and sodium.   Balance meals       Comment Richardson Landry enjoyed his appointment with the dietician.  He has already started to make changes to his diet.  He no longer adds any salt to his food.  He is trying to eat a more balanced diet.  He does not want to use the app and just wants to continue to make better choices.       Expected Outcome Short: Continue to make changes in diet.  Long: Eat a heart healhty diet.          Nutrition Goals Discharge (Final Nutrition Goals Re-Evaluation):     Nutrition Goals Re-Evaluation - 12/07/16 0836      Goals   Current Weight 242 lb (109.8 kg)   Nutrition Goal Read labels for saturated and trans fat and sodium.  Balance meals   Comment Richardson Landry enjoyed his appointment with the dietician.  He has already started to make changes to his diet.  He no longer adds any salt to his food.  He is trying to eat a more balanced diet.  He does not want to use the app and just wants to continue to make better choices.   Expected Outcome Short: Continue to make changes in diet.  Long: Eat a heart healhty diet.      Psychosocial: Target Goals: Acknowledge presence or absence of significant depression and/or stress, maximize coping skills, provide positive support system. Participant is able to verbalize types and ability to use techniques and skills needed for reducing stress and depression.   Initial Review & Psychosocial Screening:     Initial Psych Review & Screening - 11/19/16 1252      Screening Interventions   Interventions Encouraged to exercise  Quality of Life Scores:      Quality of Life - 11/19/16 1254      Quality of Life Scores   Health/Function Pre 19.07 %   Socioeconomic Pre 21.07 %   Psych/Spiritual Pre 20.21 %   Family Pre 23 %   GLOBAL Pre 20.21 %      PHQ-9: Recent Review Flowsheet Data    Depression screen [facility] 2/9 11/19/2016   Decreased Interest 1   Down, Depressed, Hopeless 1   PHQ - 2 Score 2   Altered sleeping 2   Tired, decreased energy 1    Change in appetite 0   Feeling bad or failure about yourself  0   Trouble concentrating 0   Moving slowly or fidgety/restless 0   Suicidal thoughts 0   PHQ-9 Score 5   Difficult doing work/chores Not difficult at all     Interpretation of Total Score  Total Score Depression Severity:  1-4 = Minimal depression, 5-9 = Mild depression, 10-14 = Moderate depression, 15-19 = Moderately severe depression, 20-27 = Severe depression   Psychosocial Evaluation and Intervention:   Psychosocial Re-Evaluation:     Psychosocial Re-Evaluation    Row Name 12/07/16 [phone]             Psychosocial Re-Evaluation   Current issues with Current Sleep Concerns;Current Stress Concerns       Comments Richardson Landry is doing better mentally.  He no longer sits around thinking about his heart event and his future.  Now he is able to get up and go and not dwell on everything that has happened.  He is feeling better and more positive, he does not want to go on anit-depressants and exercise really seems to be helping.  He has a good support system in his girlfriend, she even came to class and his dietician appointment with him.  He has not been sleeping well as the dog has been waking him up at 2 am.        Expected Outcomes Short: Continue to exercise daily for endorphins.  Long: Continue to have a positive outlook.       Interventions Encouraged to attend Cardiac Rehabilitation for the exercise;Stress management education       Continue Psychosocial Services  Follow up required by staff          Psychosocial Discharge (Final Psychosocial Re-Evaluation):     Psychosocial Re-Evaluation - 12/07/16 8850      Psychosocial Re-Evaluation   Current issues with Current Sleep Concerns;Current Stress Concerns   Comments Richardson Landry is doing better mentally.  He no longer sits around thinking about his heart event and his future.  Now he is able to get up and go and not dwell on everything that has happened.  He is feeling  better and more positive, he does not want to go on anit-depressants and exercise really seems to be helping.  He has a good support system in his girlfriend, she even came to class and his dietician appointment with him.  He has not been sleeping well as the dog has been waking him up at 2 am.    Expected Outcomes Short: Continue to exercise daily for endorphins.  Long: Continue to have a positive outlook.   Interventions Encouraged to attend Cardiac Rehabilitation for the exercise;Stress management education   Continue Psychosocial Services  Follow up required by staff      Vocational Rehabilitation: Provide vocational rehab assistance to qualifying candidates.   Vocational Rehab Evaluation &  Intervention:     Vocational Rehab - 11/19/16 1243      Initial Vocational Rehab Evaluation & Intervention   Assessment shows need for Vocational Rehabilitation No      Education: Education Goals: Education classes will be provided on a weekly basis, covering required topics. Participant will state understanding/return demonstration of topics presented.  Learning Barriers/Preferences:     Learning Barriers/Preferences - 11/19/16 1242      Learning Barriers/Preferences   Learning Barriers None   Learning Preferences Computer/Internet;Individual Instruction      Education Topics: General Nutrition Guidelines/Fats and Fiber: -Group instruction provided by verbal, written material, models and posters to present the general guidelines for heart healthy nutrition. Gives an explanation and review of dietary fats and fiber.   Cardiac Rehab from 12/12/2016 in [facility] Cardiac and Pulmonary Rehab  Date  11/26/16  Educator  CR  Instruction Review Code  2- meets goals/outcomes      Controlling Sodium/Reading Food Labels: -Group verbal and written material supporting the discussion of sodium use in heart healthy nutrition. Review and explanation with models, verbal and written materials for  utilization of the food label.   Cardiac Rehab from 12/12/2016 in [facility] Cardiac and Pulmonary Rehab  Date  12/03/16  Educator  CR  Instruction Review Code  2- meets goals/outcomes      Exercise Physiology & Risk Factors: - Group verbal and written instruction with models to review the exercise physiology of the cardiovascular system and associated critical values. Details cardiovascular disease risk factors and the goals associated with each risk factor.   Cardiac Rehab from 12/12/2016 in [facility] Cardiac and Pulmonary Rehab  Date  12/10/16  Educator  [facility]  Instruction Review Code  2- meets goals/outcomes      Aerobic Exercise & Resistance Training: - Gives group verbal and written discussion on the health impact of inactivity. On the components of aerobic and resistive training programs and the benefits of this training and how to safely progress through these programs.   Cardiac Rehab from 12/12/2016 in [facility] Cardiac and Pulmonary Rehab  Date  12/12/16  Educator  SB  Instruction Review Code  2- meets goals/outcomes      Flexibility, Balance, General Exercise Guidelines: - Provides group verbal and written instruction on the benefits of flexibility and balance training programs. Provides general exercise guidelines with specific guidelines to those with heart or lung disease. Demonstration and skill practice provided.   Stress Management: - Provides group verbal and written instruction about the health risks of elevated stress, cause of high stress, and healthy ways to reduce stress.   Depression: - Provides group verbal and written instruction on the correlation between heart/lung disease and depressed mood, treatment options, and the stigmas associated with seeking treatment.   Cardiac Rehab from 12/12/2016 in [facility] Cardiac and Pulmonary Rehab  Date  11/28/16  Educator  [facility]  Instruction Review Code  2- meets goals/outcomes      Anatomy & Physiology of the Heart: - Group verbal and  written instruction and models provide basic cardiac anatomy and physiology, with the coronary electrical and arterial systems. Review of: AMI, Angina, Valve disease, Heart Failure, Cardiac Arrhythmia, Pacemakers, and the ICD.   Cardiac Procedures: - Group verbal and written instruction and models to describe the testing methods done to diagnose heart disease. Reviews the outcomes of the test results. Describes the treatment choices: Medical Management, Angioplasty, or Coronary Bypass Surgery.   Cardiac Medications: - Group verbal and written instruction to review commonly  prescribed medications for heart disease. Reviews the medication, class of the drug, and side effects. Includes the steps to properly store meds and maintain the prescription regimen.   Go Sex-Intimacy & Heart Disease, Get SMART - Goal Setting: - Group verbal and written instruction through game format to discuss heart disease and the return to sexual intimacy. Provides group verbal and written material to discuss and apply goal setting through the application of the S.M.A.R.T. Method.   Other Matters of the Heart: - Provides group verbal, written materials and models to describe Heart Failure, Angina, Valve Disease, and Diabetes in the realm of heart disease. Includes description of the disease process and treatment options available to the cardiac patient.   Exercise & Equipment Safety: - Individual verbal instruction and demonstration of equipment use and safety with use of the equipment.   Cardiac Rehab from 12/12/2016 in [facility] Cardiac and Pulmonary Rehab  Date  11/19/16  Educator  C. EnterkinRN  Instruction Review Code  1- partially meets, needs review/practice      Infection Prevention: - Provides verbal and written material to individual with discussion of infection control including proper hand washing and proper equipment cleaning during exercise session.   Cardiac Rehab from 12/12/2016 in [facility] Cardiac and  Pulmonary Rehab  Date  11/19/16  Educator  C. EnterkinRN  Instruction Review Code  2- meets goals/outcomes      Falls Prevention: - Provides verbal and written material to individual with discussion of falls prevention and safety.   Cardiac Rehab from 12/12/2016 in [facility] Cardiac and Pulmonary Rehab  Date  11/19/16  Educator  C. ENterkinRN  Instruction Review Code  2- meets goals/outcomes      Diabetes: - Individual verbal and written instruction to review signs/symptoms of diabetes, desired ranges of glucose level fasting, after meals and with exercise. Advice that pre and post exercise glucose checks will be done for 3 sessions at entry of program.    Knowledge Questionnaire Score:     Knowledge Questionnaire Score - 11/19/16 1242      Knowledge Questionnaire Score   Pre Score 20/28      Core Components/Risk Factors/Patient Goals at Admission:     Personal Goals and Risk Factors at Admission - 11/19/16 1248      Core Components/Risk Factors/Patient Goals on Admission    Weight Management Yes   Intervention Weight Management: Develop a combined nutrition and exercise program designed to reach desired caloric intake, while maintaining appropriate intake of nutrient and fiber, sodium and fats, and appropriate energy expenditure required for the weight goal.;Weight Management: Provide education and appropriate resources to help participant work on and attain dietary goals.;Weight Management/Obesity: Establish reasonable short term and long term weight goals.   Admit Weight 250 lb (113.4 kg)   Goal Weight: Short Term 245 lb (111.1 kg)   Goal Weight: Long Term 230 lb (104.3 kg)   Expected Outcomes Short Term: Continue to assess and modify interventions until short term weight is achieved;Weight Loss: Understanding of general recommendations for a balanced deficit meal plan, which promotes 1-2 lb weight loss per week and includes a negative energy balance of [phone]  kcal/d;Understanding recommendations for meals to include 15-35% energy as protein, 25-35% energy from fat, 35-60% energy from carbohydrates, less than 225m of dietary cholesterol, 20-35 gm of total fiber daily   Tobacco Cessation Yes   Number of packs per day Quit smoking 1/4 pack day times 30 years the day he had his heart attack on 10/30/2016  Intervention Advice worker, assist with locating and accessing local/national Quit Smoking programs, and support quit date choice.   Expected Outcomes Long Term: Complete abstinence from all tobacco products for at least 12 months from quit date.;Short Term: Will quit all tobacco product use, adhering to prevention of relapse plan.   Hypertension Yes   Intervention Provide education on lifestyle modifcations including regular physical activity/exercise, weight management, moderate sodium restriction and increased consumption of fresh fruit, vegetables, and low fat dairy, alcohol moderation, and smoking cessation.;Monitor prescription use compliance.   Expected Outcomes Short Term: Continued assessment and intervention until BP is < 140/12m HG in hypertensive participants. < 130/84mHG in hypertensive participants with diabetes, heart failure or chronic kidney disease.;Long Term: Maintenance of blood pressure at goal levels.   Lipids Yes   Intervention Provide education and support for participant on nutrition & aerobic/resistive exercise along with prescribed medications to achieve LDL <701mHDL >7m16m Expected Outcomes Short Term: Participant states understanding of desired cholesterol values and is compliant with medications prescribed. Participant is following exercise prescription and nutrition guidelines.;Long Term: Cholesterol controlled with medications as prescribed, with individualized exercise RX and with personalized nutrition plan. Value goals: LDL < 70mg21mL > 40 mg.   Stress Yes   Intervention Offer individual and/or small  group education and counseling on adjustment to heart disease, stress management and health-related lifestyle change. Teach and support self-help strategies.;Refer participants experiencing significant psychosocial distress to appropriate mental health specialists for further evaluation and treatment. When possible, include family members and significant others in education/counseling sessions.   Expected Outcomes Short Term: Participant demonstrates changes in health-related behavior, relaxation and other stress management skills, ability to obtain effective social support, and compliance with psychotropic medications if prescribed.;Long Term: Emotional wellbeing is indicated by absence of clinically significant psychosocial distress or social isolation.      Core Components/Risk Factors/Patient Goals Review:      Goals and Risk Factor Review    Row Name 12/07/16 0841             Core Components/Risk Factors/Patient Goals Review   Personal Goals Review Weight Management/Obesity;Tobacco Cessation;Hypertension;Lipids       Review Steve's weight is down to 242 lbs!  He has already met his short term goal for weight loss!!  He has stayed away from the cigarrettes!!  He is feeling better and gaining confidence everyday.  His blood pressure was up today, but overall has been going down.  He has his lipids checked recently and they have greatly improved!!  He is hoping that when he goes in for his appointment in two weeks they may back off his lipitor.  He has his echo scheduled for 7/16! He really is looking forward to getting rid of the Life Vest!!       Expected Outcomes Short: Go in for echo and find out more about his lipids.  Long: Continue to work on weight loss and staying smoke free.          Core Components/Risk Factors/Patient Goals at Discharge (Final Review):      Goals and Risk Factor Review - 12/07/16 0841      Core Components/Risk Factors/Patient Goals Review   Personal Goals  Review Weight Management/Obesity;Tobacco Cessation;Hypertension;Lipids   Review Steve's weight is down to 242 lbs!  He has already met his short term goal for weight loss!!  He has stayed away from the cigarrettes!!  He is feeling better and gaining confidence everyday.  His blood  pressure was up today, but overall has been going down.  He has his lipids checked recently and they have greatly improved!!  He is hoping that when he goes in for his appointment in two weeks they may back off his lipitor.  He has his echo scheduled for 7/16! He really is looking forward to getting rid of the Life Vest!!   Expected Outcomes Short: Go in for echo and find out more about his lipids.  Long: Continue to work on weight loss and staying smoke free.      ITP Comments:     ITP Comments    Row Name 11/19/16 1244 11/19/16 1250 11/19/16 1252 11/21/16 0652 12/11/16 1447   ITP Comments ITP Created during Medical Review after Cardiac REhab Informed consent was signed Diagnosis was in Discharge Summary 11/04/2016 Quit smoking 1/4 pack cigarettes /day times 30 years the day he had his heart attack on 10/30/2016 Both of his parents have died. Kiefer recently found out that his father when he was 30 had a heart stent placed after he was in a bad car accident. Ules is concerned and would like to know if his heart is going to get stronger since he knows he has to wear the Lifevest since he was told in the hospital he had a vfib arrest but he doesnt'want an internal defib implatnet (AICD) 30 day review. Continue with ITP unless directed changes per Medical Director review.  New to program Approximately 30 minutes after check in during education on 12/10/2016 patient's HR decreased to 38 - Sinus Bradycardia.  Patient asymptomatic, alert and oriented.  Upon arrival to Cardiac Rehab HR was in the 70s - NSR.  Noted during the previous sessions patient's HR was 57 and 55 respectively.  Patient on Metoprolol (Toprol XL) 25 mg daily.  A note  was faxed to Dr. Saralyn Pilar to make him aware.  Awaiting response from MD.       Yulee Name 12/19/16 0634           ITP Comments 30 day review. Continue with ITP unless directed changes per Medical Director review             Comments:

## 2016-12-19 NOTE — Progress Notes (Signed)
Daily Session Note  Patient Details  Name: William Bates MRN: [id number] Date of Birth: 09-12-1965 Referring Provider:     Cardiac Rehab from 11/19/2016 in [facility] Cardiac and Pulmonary Rehab  Referring Provider  Isaias Cowman MD      Encounter Date: 12/19/2016  Check In:     Session Check In - 12/19/16 0756      Check-In   Location [facility]-Cardiac & Pulmonary Rehab   Staff Present Darel Hong, RN BSN;Zayne Draheim Darrin Nipper, [place], ACSM RCEP, Exercise Physiologist   Supervising physician immediately available to respond to emergencies See telemetry face sheet for immediately available ER MD   Medication changes reported     Yes   Comments Metoprolol reduced by half   Fall or balance concerns reported    No   Tobacco Cessation No Change   Warm-up and Cool-down Performed on first and last piece of equipment   Resistance Training Performed Yes   VAD Patient? No     Pain Assessment   Currently in Pain? No/denies   Multiple Pain Sites No         History  Smoking Status  . Former Smoker  . Packs/day: 0.50  . Years: 30.00  . Quit date: 10/30/2016  Smokeless Tobacco  . Former User    Comment: Remains Quit 12/14/2016    Goals Met:  Independence with exercise equipment Exercise tolerated well No report of cardiac concerns or symptoms Strength training completed today  Goals Unmet:  Not Applicable  Comments: Pt able to follow exercise prescription today without complaint.  Will continue to monitor for progression.   Dr. Emily Filbert is Medical Director for [facility] and [name].

## 2016-12-21 DIAGNOSIS — Z955 Presence of coronary angioplasty implant and graft: Secondary | ICD-10-CM | POA: Diagnosis not present

## 2016-12-21 DIAGNOSIS — I213 ST elevation (STEMI) myocardial infarction of unspecified site: Secondary | ICD-10-CM

## 2016-12-21 NOTE — Progress Notes (Signed)
Daily Session Note  Patient Details  Name: William Bates MRN: [id number] Date of Birth: 1965-10-20 Referring Provider:     Cardiac Rehab from 11/19/2016 in [facility] Cardiac and Pulmonary Rehab  Referring Provider  Isaias Cowman MD      Encounter Date: 12/21/2016  Check In:     Session Check In - 12/21/16 0856      Check-In   Location [facility]-Cardiac & Pulmonary Rehab   Staff Present Alberteen Sam, MA, ACSM RCEP, Exercise Physiologist;Kerem Gilmer Oletta Darter, BA, ACSM CEP, Exercise Physiologist;Diane [facility] RN,BSN   Supervising physician immediately available to respond to emergencies See telemetry face sheet for immediately available ER MD   Medication changes reported     No   Fall or balance concerns reported    No   Warm-up and Cool-down Performed on first and last piece of equipment   Resistance Training Performed Yes   VAD Patient? No     Pain Assessment   Currently in Pain? No/denies         History  Smoking Status  . Former Smoker  . Packs/day: 0.50  . Years: 30.00  . Quit date: 10/30/2016  Smokeless Tobacco  . Former User    Comment: Remains Quit 12/14/2016    Goals Met:  Independence with exercise equipment Exercise tolerated well No report of cardiac concerns or symptoms Strength training completed today  Goals Unmet:  Not Applicable  Comments: Pt able to follow exercise prescription today without complaint.  Will continue to monitor for progression.    Dr. Emily Filbert is Medical Director for [facility] and [name].

## 2016-12-24 ENCOUNTER — Encounter: Payer: 59 | Admitting: *Deleted

## 2016-12-24 DIAGNOSIS — I213 ST elevation (STEMI) myocardial infarction of unspecified site: Secondary | ICD-10-CM

## 2016-12-24 DIAGNOSIS — Z955 Presence of coronary angioplasty implant and graft: Secondary | ICD-10-CM | POA: Diagnosis not present

## 2016-12-24 NOTE — Progress Notes (Signed)
Daily Session Note  Patient Details  Name: William Bates MRN: [id number] Date of Birth: [birth year] Referring Provider:     Cardiac Rehab from 11/19/2016 in [facility] Cardiac and Pulmonary Rehab  Referring Provider  Isaias Cowman MD      Encounter Date: 12/24/2016  Check In:     Session Check In - 12/24/16 0743      Check-In   Location [facility]-Cardiac & Pulmonary Rehab   Staff Present Gerlene Burdock, RN, Levie Heritage, MA, ACSM RCEP, Exercise Physiologist;Kashmir Lysaght Amedeo Plenty, BS, ACSM CEP, Exercise Physiologist   Supervising physician immediately available to respond to emergencies See telemetry face sheet for immediately available ER MD   Medication changes reported     No   Fall or balance concerns reported    No   Warm-up and Cool-down Performed on first and last piece of equipment   Resistance Training Performed Yes   VAD Patient? No     Pain Assessment   Currently in Pain? No/denies   Multiple Pain Sites No         History  Smoking Status  . Former Smoker  . Packs/day: 0.50  . Years: 30.00  . Quit date: 10/30/2016  Smokeless Tobacco  . Former User    Comment: Remains Quit 12/14/2016    Goals Met:  Independence with exercise equipment Exercise tolerated well No report of cardiac concerns or symptoms Strength training completed today  Goals Unmet:  Not Applicable  Comments: Pt able to follow exercise prescription today without complaint.  Will continue to monitor for progression.    Dr. Emily Filbert is Medical Director for [facility] and [name].

## 2016-12-26 DIAGNOSIS — Z955 Presence of coronary angioplasty implant and graft: Secondary | ICD-10-CM | POA: Diagnosis not present

## 2016-12-26 DIAGNOSIS — I213 ST elevation (STEMI) myocardial infarction of unspecified site: Secondary | ICD-10-CM

## 2016-12-26 NOTE — Progress Notes (Signed)
Daily Session Note  Patient Details  Name: William Bates MRN: [id number] Date of Birth: [birth year] Referring Provider:     Cardiac Rehab from 11/19/2016 in [facility] Cardiac and Pulmonary Rehab  Referring Provider  Isaias Cowman MD      Encounter Date: 12/26/2016  Check In:     Session Check In - 12/26/16 0759      Check-In   Location [facility]-Cardiac & Pulmonary Rehab   Staff Present Heath Lark, RN, BSN, CCRP;Jessica Luan Pulling, MA, ACSM RCEP, Exercise Physiologist;Zubair Lofton Flavia Shipper   Supervising physician immediately available to respond to emergencies See telemetry face sheet for immediately available ER MD   Medication changes reported     No   Fall or balance concerns reported    No   Warm-up and Cool-down Performed on first and last piece of equipment   Resistance Training Performed Yes   VAD Patient? No     Pain Assessment   Currently in Pain? No/denies   Multiple Pain Sites No         History  Smoking Status  . Former Smoker  . Packs/day: 0.50  . Years: 30.00  . Quit date: 10/30/2016  Smokeless Tobacco  . Former User    Comment: Remains Quit 12/14/2016    Goals Met:  Independence with exercise equipment Exercise tolerated well No report of cardiac concerns or symptoms Strength training completed today  Goals Unmet:  Not Applicable  Comments: Pt able to follow exercise prescription today without complaint.  Will continue to monitor for progression.   Dr. Emily Filbert is Medical Director for [name] and [name].

## 2016-12-28 ENCOUNTER — Encounter: Payer: 59 | Admitting: *Deleted

## 2016-12-28 DIAGNOSIS — Z955 Presence of coronary angioplasty implant and graft: Secondary | ICD-10-CM

## 2016-12-28 DIAGNOSIS — I213 ST elevation (STEMI) myocardial infarction of unspecified site: Secondary | ICD-10-CM

## 2016-12-28 NOTE — Progress Notes (Signed)
Daily Session Note  Patient Details  Name: William Bates MRN: [id number] Date of Birth: 03/14/1966 Referring Provider:     Cardiac Rehab from 11/19/2016 in [facility] Cardiac and Pulmonary Rehab  Referring Provider  Paraschos, Alexander MD      Encounter Date: 12/28/2016  Check In:     Session Check In - 12/28/16 0934      Check-In   Location [facility]-Cardiac & Pulmonary Rehab   Staff Present  , MA, ACSM RCEP, Exercise Physiologist;Amanda Sommer, BA, ACSM CEP, Exercise Physiologist;Carroll Enterkin, RN, BSN   Medication changes reported     No   Fall or balance concerns reported    No   Warm-up and Cool-down Performed on first and last piece of equipment   Resistance Training Performed Yes   VAD Patient? No     Pain Assessment   Currently in Pain? No/denies   Multiple Pain Sites No           Exercise Prescription Changes - 12/27/16 1100      Response to Exercise   Blood Pressure (Admit) 134/64   Blood Pressure (Exercise) 142/72   Blood Pressure (Exit) 104/62   Heart Rate (Admit) 65 bpm   Heart Rate (Exercise) 114 bpm   Heart Rate (Exit) 60 bpm   Rating of Perceived Exertion (Exercise) 12   Symptoms none   Comments rhythm change during education   Duration Continue with 45 min of aerobic exercise without signs/symptoms of physical distress.   Intensity THRR unchanged     Progression   Progression Continue to progress workloads to maintain intensity without signs/symptoms of physical distress.   Average METs 8.9     Resistance Training   Training Prescription Yes   Weight 10 lbs   Reps 10-15     Interval Training   Interval Training Yes   Equipment Treadmill   Comments 2 min off 30 sec on     Treadmill   MPH 3.7   Grade 10   Minutes 15   METs 8.9     Elliptical   Level 5.4   Speed 5.8   Minutes 30     Home Exercise Plan   [facility]  Full Time Fitness   Frequency Add 3 additional days to  program exercise sessions.   Initial Home Exercises Provided 11/23/16      History  Smoking Status  . Former Smoker  . Packs/day: 0.50  . Years: 30.00  . Quit date: 10/30/2016  Smokeless Tobacco  . Former User    Comment: Remains Quit 12/14/2016    Goals Met:  Independence with exercise equipment Exercise tolerated well Personal goals reviewed No report of cardiac concerns or symptoms Strength training completed today  Goals Unmet:  Not Applicable  Comments: Pt able to follow exercise prescription today without complaint.  Will continue to monitor for progression.    Dr. Mark Miller is Medical Director for [facility] and [name]. 

## 2016-12-31 ENCOUNTER — Encounter: Payer: 59 | Admitting: *Deleted

## 2016-12-31 DIAGNOSIS — Z955 Presence of coronary angioplasty implant and graft: Secondary | ICD-10-CM | POA: Diagnosis not present

## 2016-12-31 DIAGNOSIS — I213 ST elevation (STEMI) myocardial infarction of unspecified site: Secondary | ICD-10-CM

## 2016-12-31 NOTE — Progress Notes (Signed)
Daily Session Note  Patient Details  Name: William Bates MRN: [id number] Date of Birth: 11/02/1965 Referring Provider:     Cardiac Rehab from 11/19/2016 in [facility] Cardiac and Pulmonary Rehab  Referring Provider  Paraschos, Alexander MD      Encounter Date: 12/31/2016  Check In:     Session Check In - 12/31/16 0746      Check-In   Location [facility]-Cardiac & Pulmonary Rehab   Staff Present Carroll Enterkin, RN, BSN;Jessica Hawkins, MA, ACSM RCEP, Exercise Physiologist;Kelly Hayes, BS, ACSM CEP, Exercise Physiologist   Supervising physician immediately available to respond to emergencies See telemetry face sheet for immediately available ER MD   Medication changes reported     No   Fall or balance concerns reported    No   Warm-up and Cool-down Performed on first and last piece of equipment   Resistance Training Performed Yes   VAD Patient? No     Pain Assessment   Currently in Pain? No/denies   Multiple Pain Sites No         History  Smoking Status  . Former Smoker  . Packs/day: 0.50  . Years: 30.00  . Quit date: 10/30/2016  Smokeless Tobacco  . Former User    Comment: Remains Quit 12/14/2016    Goals Met:  Independence with exercise equipment Exercise tolerated well No report of cardiac concerns or symptoms Strength training completed today  Goals Unmet:  Not Applicable  Comments: Pt able to follow exercise prescription today without complaint.  Will continue to monitor for progression.    Dr. Mark Miller is Medical Director for [facility] and [name]. 

## 2017-01-02 ENCOUNTER — Encounter: Payer: 59 | Attending: Cardiology

## 2017-01-02 DIAGNOSIS — I213 ST elevation (STEMI) myocardial infarction of unspecified site: Secondary | ICD-10-CM | POA: Insufficient documentation

## 2017-01-02 DIAGNOSIS — Z955 Presence of coronary angioplasty implant and graft: Secondary | ICD-10-CM | POA: Diagnosis present

## 2017-01-02 NOTE — Progress Notes (Signed)
Daily Session Note  Patient Details  Name: William Bates MRN: [id number] Date of Birth: 02/09/1966 Referring Provider:     Cardiac Rehab from 11/19/2016 in [facility] Cardiac and Pulmonary Rehab  Referring Provider  Paraschos, Alexander MD      Encounter Date: 01/02/2017  Check In:     Session Check In - 01/02/17 0841      Check-In   Location [facility]-Cardiac & Pulmonary Rehab   Staff Present Susanne Bice, RN, BSN, CCRP;Jessica Hawkins, MA, ACSM RCEP, Exercise Physiologist;Joseph Hood RCP,RRT,BSRT   Supervising physician immediately available to respond to emergencies See telemetry face sheet for immediately available ER MD   Medication changes reported     No   Fall or balance concerns reported    No   Warm-up and Cool-down Performed on first and last piece of equipment   Resistance Training Performed Yes   VAD Patient? No     Pain Assessment   Currently in Pain? No/denies   Multiple Pain Sites No         History  Smoking Status  . Former Smoker  . Packs/day: 0.50  . Years: 30.00  . Quit date: 10/30/2016  Smokeless Tobacco  . Former User    Comment: Remains Quit 12/14/2016    Goals Met:  Independence with exercise equipment Exercise tolerated well No report of cardiac concerns or symptoms Strength training completed today  Goals Unmet:  Not Applicable  Comments: Pt able to follow exercise prescription today without complaint.  Will continue to monitor for progression.   Dr. Mark Miller is Medical Director for [facility] and [name]. 

## 2017-01-04 ENCOUNTER — Encounter: Payer: 59 | Admitting: *Deleted

## 2017-01-04 DIAGNOSIS — I213 ST elevation (STEMI) myocardial infarction of unspecified site: Secondary | ICD-10-CM

## 2017-01-04 DIAGNOSIS — Z955 Presence of coronary angioplasty implant and graft: Secondary | ICD-10-CM

## 2017-01-04 NOTE — Progress Notes (Signed)
Daily Session Note  Patient Details  Name: William Bates MRN: [id number] Date of Birth: 06/09/1965 Referring Provider:     Cardiac Rehab from 11/19/2016 in [facility] Cardiac and Pulmonary Rehab  Referring Provider  Paraschos, Alexander MD      Encounter Date: 01/04/2017  Check In:     Session Check In - 01/04/17 0821      Check-In   Location [facility]-Cardiac & Pulmonary Rehab   Staff Present Carroll Enterkin, RN, BSN;Jessica Hawkins, MA, ACSM RCEP, Exercise Physiologist;Amanda Sommer, BA, ACSM CEP, Exercise Physiologist   Supervising physician immediately available to respond to emergencies See telemetry face sheet for immediately available ER MD   Medication changes reported     No   Fall or balance concerns reported    No   Warm-up and Cool-down Performed on first and last piece of equipment   Resistance Training Performed Yes   VAD Patient? No     Pain Assessment   Currently in Pain? No/denies   Multiple Pain Sites No         History  Smoking Status  . Former Smoker  . Packs/day: 0.50  . Years: 30.00  . Quit date: 10/30/2016  Smokeless Tobacco  . Former User    Comment: Remains Quit 12/14/2016    Goals Met:  Independence with exercise equipment Exercise tolerated well No report of cardiac concerns or symptoms Strength training completed today  Goals Unmet:  Not Applicable  Comments: Pt able to follow exercise prescription today without complaint.  Will continue to monitor for progression.    Dr. Mark Miller is Medical Director for [facility] and [name]. 

## 2017-01-07 ENCOUNTER — Encounter: Payer: 59 | Admitting: *Deleted

## 2017-01-07 DIAGNOSIS — I213 ST elevation (STEMI) myocardial infarction of unspecified site: Secondary | ICD-10-CM

## 2017-01-07 DIAGNOSIS — Z955 Presence of coronary angioplasty implant and graft: Secondary | ICD-10-CM | POA: Diagnosis not present

## 2017-01-07 NOTE — Progress Notes (Signed)
Daily Session Note  Patient Details  Name: ZYAN COBY MRN: [id number] Date of Birth: Mar 02, 1966 Referring Provider:     Cardiac Rehab from 11/19/2016 in [facility] Cardiac and Pulmonary Rehab  Referring Provider  Isaias Cowman MD      Encounter Date: 01/07/2017  Check In:     Session Check In - 01/07/17 0749      Check-In   Location [facility]-Cardiac & Pulmonary Rehab   Staff Present Gerlene Burdock, RN, Levie Heritage, MA, ACSM RCEP, Exercise Physiologist;Kelly Amedeo Plenty, BS, ACSM CEP, Exercise Physiologist   Supervising physician immediately available to respond to emergencies See telemetry face sheet for immediately available ER MD   Medication changes reported     No   Fall or balance concerns reported    No   Warm-up and Cool-down Performed on first and last piece of equipment   Resistance Training Performed Yes   VAD Patient? No     Pain Assessment   Currently in Pain? No/denies   Multiple Pain Sites No         History  Smoking Status  . Former Smoker  . Packs/day: 0.50  . Years: 30.00  . Quit date: 10/30/2016  Smokeless Tobacco  . Former User    Comment: Remains Quit 12/14/2016    Goals Met:  Independence with exercise equipment Exercise tolerated well No report of cardiac concerns or symptoms Strength training completed today  Goals Unmet:  Not Applicable  Comments: Pt able to follow exercise prescription today without complaint.  Will continue to monitor for progression.    Dr. Emily Filbert is Medical Director for [facility] and [name].

## 2017-01-09 ENCOUNTER — Encounter: Payer: 59 | Admitting: *Deleted

## 2017-01-09 VITALS — Ht 72.9 in | Wt 242.0 lb

## 2017-01-09 DIAGNOSIS — I213 ST elevation (STEMI) myocardial infarction of unspecified site: Secondary | ICD-10-CM

## 2017-01-09 DIAGNOSIS — Z955 Presence of coronary angioplasty implant and graft: Secondary | ICD-10-CM | POA: Diagnosis not present

## 2017-01-09 NOTE — Progress Notes (Signed)
Daily Session Note  Patient Details  Name: William Bates MRN: [id number] Date of Birth: 31-Jan-1966 Referring Provider:     Cardiac Rehab from 11/19/2016 in [facility] Cardiac and Pulmonary Rehab  Referring Provider  Isaias Cowman MD      Encounter Date: 01/09/2017  Check In:     Session Check In - 01/09/17 0759      Check-In   Location [facility]-Cardiac & Pulmonary Rehab   Staff Present Gerlene Burdock, RN, Levie Heritage, MA, ACSM RCEP, Exercise Physiologist;Joseph Flavia Shipper   Supervising physician immediately available to respond to emergencies See telemetry face sheet for immediately available ER MD   Medication changes reported     No   Fall or balance concerns reported    No   Warm-up and Cool-down Performed on first and last piece of equipment   Resistance Training Performed Yes   VAD Patient? No     Pain Assessment   Currently in Pain? No/denies   Multiple Pain Sites No         History  Smoking Status  . Former Smoker  . Packs/day: 0.50  . Years: 30.00  . Quit date: 10/30/2016  Smokeless Tobacco  . Former User    Comment: Remains Quit 12/14/2016    Goals Met:  Independence with exercise equipment Exercise tolerated well No report of cardiac concerns or symptoms Strength training completed today  Goals Unmet:  Not Applicable  Comments: Pt able to follow exercise prescription today without complaint.  Will continue to monitor for progression.     [place] Name 11/19/16 1424 01/09/17 0835       6 Minute Walk   Phase Initial Discharge    Distance 1545 feet 1740 feet    Distance % Change  - 12.6 %  195 ft    Walk Time 6 minutes 6 minutes    # of Rest Breaks 0 0    MPH 2.93 3.3    METS 4.09 4.23    RPE 7 7    VO2 Peak 14.31 14.8    Symptoms No No    Resting HR 45 bpm 58 bpm    Resting BP 112/64 110/74    Max Ex. HR 77 bpm 127 bpm    Max Ex. BP 164/66 112/78    2 Minute Post BP 128/70  -         Dr. Emily Filbert is  Medical Director for [place] [name].

## 2017-01-09 NOTE — Progress Notes (Signed)
Daily Session Note  Patient Details  Name: William Bates MRN: [id number] Date of Birth: Jan 16, 1966 Referring Provider:     Cardiac Rehab from 11/19/2016 in [facility] Cardiac and Pulmonary Rehab  Referring Provider  Isaias Cowman MD      Encounter Date: 01/09/2017  Check In:     Session Check In - 01/09/17 0759      Check-In   Location [facility]-Cardiac & Pulmonary Rehab   Staff Present Gerlene Burdock, RN, Levie Heritage, MA, ACSM RCEP, Exercise Physiologist;Joseph Flavia Shipper   Supervising physician immediately available to respond to emergencies See telemetry face sheet for immediately available ER MD   Medication changes reported     No   Fall or balance concerns reported    No   Warm-up and Cool-down Performed on first and last piece of equipment   Resistance Training Performed Yes   VAD Patient? No     Pain Assessment   Currently in Pain? No/denies   Multiple Pain Sites No         History  Smoking Status  . Former Smoker  . Packs/day: 0.50  . Years: 30.00  . Quit date: 10/30/2016  Smokeless Tobacco  . Former User    Comment: Remains Quit 12/14/2016    Goals Met:  Proper associated with RPD/PD & O2 Sat Exercise tolerated well No report of cardiac concerns or symptoms Strength training completed today  Goals Unmet:  Not Applicable  Comments:     Dr. Emily Filbert is Medical Director for [place] [name].

## 2017-01-11 ENCOUNTER — Encounter: Payer: 59 | Admitting: *Deleted

## 2017-01-11 DIAGNOSIS — Z955 Presence of coronary angioplasty implant and graft: Secondary | ICD-10-CM | POA: Diagnosis not present

## 2017-01-11 DIAGNOSIS — I213 ST elevation (STEMI) myocardial infarction of unspecified site: Secondary | ICD-10-CM

## 2017-01-11 NOTE — Progress Notes (Signed)
Daily Session Note  Patient Details  Name: William Bates MRN: [id number] Date of Birth: 1965/11/21 Referring Provider:     Cardiac Rehab from 11/19/2016 in [facility] Cardiac and Pulmonary Rehab  Referring Provider  Isaias Cowman MD      Encounter Date: 01/11/2017  Check In:     Session Check In - 01/11/17 0751      Check-In   Location [facility]-Cardiac & Pulmonary Rehab   Staff Present Alberteen Sam, MA, ACSM RCEP, Exercise Physiologist;Joseph Flavia Shipper;Heath Lark, RN, BSN, CCRP   Supervising physician immediately available to respond to emergencies See telemetry face sheet for immediately available ER MD   Medication changes reported     No   Fall or balance concerns reported    No   Warm-up and Cool-down Not performed (comment)   Resistance Training Performed Yes   VAD Patient? No     Pain Assessment   Currently in Pain? No/denies   Multiple Pain Sites No           Exercise Prescription Changes - 01/11/17 0900      Response to Exercise   Blood Pressure (Admit) 110/74   Blood Pressure (Exercise) 112/78   Blood Pressure (Exit) 110/78   Heart Rate (Admit) 58 bpm   Heart Rate (Exercise) 115 bpm   Heart Rate (Exit) 71 bpm   Rating of Perceived Exertion (Exercise) 13   Symptoms none   Duration Continue with 45 min of aerobic exercise without signs/symptoms of physical distress.   Intensity THRR unchanged     Progression   Progression Continue to progress workloads to maintain intensity without signs/symptoms of physical distress.   Average METs 8.9     Resistance Training   Training Prescription Yes   Weight 10 lbs   Reps 10-15     Interval Training   Interval Training Yes   Equipment Treadmill   Comments 2 min off 30 sec on     Treadmill   MPH 3.7   Grade 10   Minutes 15   METs 8.9     Elliptical   Level 5.4   Speed 5.8   Minutes 30     Home Exercise Plan   Plans to continue exercise at [facility] (comment)  Full Time Fitness    Frequency Add 3 additional days to program exercise sessions.   Initial Home Exercises Provided 11/23/16      History  Smoking Status  . Former Smoker  . Packs/day: 0.50  . Years: 30.00  . Quit date: 10/30/2016  Smokeless Tobacco  . Former User    Comment: Remains Quit 12/14/2016    Goals Met:  Independence with exercise equipment Exercise tolerated well No report of cardiac concerns or symptoms Strength training completed today  Goals Unmet:  Not Applicable  Comments: Pt able to follow exercise prescription today without complaint.  Will continue to monitor for progression.    Dr. Emily Filbert is Medical Director for [place] [name].

## 2017-01-14 ENCOUNTER — Encounter: Payer: 59 | Admitting: *Deleted

## 2017-01-14 DIAGNOSIS — I213 ST elevation (STEMI) myocardial infarction of unspecified site: Secondary | ICD-10-CM

## 2017-01-14 DIAGNOSIS — Z955 Presence of coronary angioplasty implant and graft: Secondary | ICD-10-CM | POA: Diagnosis not present

## 2017-01-14 NOTE — Progress Notes (Signed)
Daily Session Note  Patient Details  Name: William Bates MRN: [id number] Date of Birth: September 29, 1965 Referring Provider:     Cardiac Rehab from 11/19/2016 in [facility] Cardiac and Pulmonary Rehab  Referring Provider  Isaias Cowman MD      Encounter Date: 01/14/2017  Check In:     Session Check In - 01/14/17 0802      Check-In   Location [facility]-Cardiac & Pulmonary Rehab   Staff Present Alberteen Sam, MA, ACSM RCEP, Exercise Physiologist;Carroll Enterkin, RN, Moises Blood, BS, ACSM CEP, Exercise Physiologist   Supervising physician immediately available to respond to emergencies See telemetry face sheet for immediately available ER MD   Medication changes reported     No   Fall or balance concerns reported    No   Warm-up and Cool-down Performed on first and last piece of equipment   Resistance Training Performed Yes   VAD Patient? No     Pain Assessment   Currently in Pain? No/denies   Multiple Pain Sites No         History  Smoking Status  . Former Smoker  . Packs/day: 0.50  . Years: 30.00  . Quit date: 10/30/2016  Smokeless Tobacco  . Former User    Comment: Remains Quit 12/14/2016    Goals Met:  Independence with exercise equipment Exercise tolerated well No report of cardiac concerns or symptoms Strength training completed today  Goals Unmet:  Not Applicable  Comments: Pt able to follow exercise prescription today without complaint.  Will continue to monitor for progression.    Dr. Emily Filbert is Medical Director for [place] [name].

## 2017-01-16 ENCOUNTER — Encounter: Payer: Self-pay | Admitting: *Deleted

## 2017-01-16 DIAGNOSIS — I213 ST elevation (STEMI) myocardial infarction of unspecified site: Secondary | ICD-10-CM

## 2017-01-16 DIAGNOSIS — Z955 Presence of coronary angioplasty implant and graft: Secondary | ICD-10-CM

## 2017-01-16 NOTE — Progress Notes (Signed)
Cardiac Individual Treatment Plan  Patient Details  Name: William Bates MRN: [id number] Date of Birth: Jun 22, 1965 Referring Provider:     Cardiac Rehab from 11/19/2016 in [facility] Cardiac and Pulmonary Rehab  Referring Provider  Isaias Cowman MD      Initial Encounter Date:    Cardiac Rehab from 11/19/2016 in [facility] Cardiac and Pulmonary Rehab  Date  11/19/16  Referring Provider  Isaias Cowman MD      Visit Diagnosis: ST elevation myocardial infarction (STEMI), unspecified artery [facility])  Status post coronary artery stent placement  Patient's Home Medications on Admission:  Current Outpatient Prescriptions:  .  acetaminophen (TYLENOL) 500 MG tablet, Take 500 mg by mouth every 6 (six) hours as needed., Disp: , Rfl:  .  aspirin 81 MG chewable tablet, Chew 1 tablet (81 mg total) by mouth daily., Disp: , Rfl:  .  atorvastatin (LIPITOR) 40 MG tablet, Take 1 tablet (40 mg total) by mouth daily at 6 PM., Disp: 30 tablet, Rfl: 0 .  lisinopril (PRINIVIL,ZESTRIL) 2.5 MG tablet, Take 1 tablet (2.5 mg total) by mouth daily., Disp: 30 tablet, Rfl: 0 .  metoprolol succinate (TOPROL-XL) 25 MG 24 hr tablet, Take by mouth., Disp: , Rfl:  .  naproxen sodium (ANAPROX) 220 MG tablet, Take 220 mg by mouth 2 (two) times daily with a meal., Disp: , Rfl:  .  ticagrelor (BRILINTA) 90 MG TABS tablet, Take 1 tablet (90 mg total) by mouth 2 (two) times daily., Disp: 60 tablet, Rfl: 0  Past Medical History: Past Medical History:  Diagnosis Date  . Alcohol abuse   . Pre-diabetes   . Tobacco use     Tobacco Use: History  Smoking Status  . Former Smoker  . Packs/day: 0.50  . Years: 30.00  . Quit date: 10/30/2016  Smokeless Tobacco  . Former User    Comment: Remains Quit 12/14/2016    Labs: Recent Review Flowsheet Data    Labs for ITP Cardiac and Pulmonary Rehab Latest Ref Rng & Units 10/30/2016 10/31/2016 10/31/2016   Cholestrol 0 - 200 mg/dL 202(H) 160 -   LDLCALC 0 - 99 mg/dL 110(H) 103(H)  -   HDL >40 mg/dL 37(L) 30(L) -   Trlycerides <150 mg/dL 276(H) 136 136   Hemoglobin A1c 4.8 - 5.6 % 6.0(H) - -       Exercise Target Goals:    Exercise Program Goal: Individual exercise prescription set with THRR, safety & activity barriers. Participant demonstrates ability to understand and report RPE using BORG scale, to self-measure pulse accurately, and to acknowledge the importance of the exercise prescription.  Exercise Prescription Goal: Starting with aerobic activity 30 plus minutes a day, 3 days per week for initial exercise prescription. Provide home exercise prescription and guidelines that participant acknowledges understanding prior to discharge.  Activity Barriers & Risk Stratification:     Activity Barriers & Cardiac Risk Stratification - 11/19/16 1247      Activity Barriers & Cardiac Risk Stratification   Activity Barriers Arthritis;Joint Problems;Deconditioning;Muscular Weakness;Decreased Ventricular Function  multiple injuries to r knee, meniscus tears and ACL tears   Cardiac Risk Stratification High      6 Minute Walk:     6 Minute Walk    Row Name 11/19/16 1424 01/09/17 0835       6 Minute Walk   Phase Initial Discharge    Distance 1545 feet 1740 feet    Distance % Change  - 12.6 %  195 ft    Walk Time  6 minutes 6 minutes    # of Rest Breaks 0 0    MPH 2.93 3.3    METS 4.09 4.23    RPE 7 7    VO2 Peak 14.31 14.8    Symptoms No No    Resting HR 45 bpm 58 bpm    Resting BP 112/64 110/74    Max Ex. HR 77 bpm 127 bpm    Max Ex. BP 164/66 112/78    2 Minute Post BP 128/70  -       Oxygen Initial Assessment:   Oxygen Re-Evaluation:   Oxygen Discharge (Final Oxygen Re-Evaluation):   Initial Exercise Prescription:     Initial Exercise Prescription - 11/19/16 1400      Date of Initial Exercise RX and Referring Provider   Date 11/19/16   Referring Provider Paraschos, Alexander MD     Treadmill   MPH 3   Grade 1   Minutes 15    METs 3.71     Elliptical   Level 1   Speed 4   Minutes 15     REL-XR   Level 3   Speed 50   Minutes 15   METs 3     Prescription Details   Frequency (times per week) 3   Duration Progress to 45 minutes of aerobic exercise without signs/symptoms of physical distress     Intensity   THRR 40-80% of Max Heartrate 96-145   Ratings of Perceived Exertion 11-13   Perceived Dyspnea 0-4     Progression   Progression Continue to progress workloads to maintain intensity without signs/symptoms of physical distress.     Resistance Training   Training Prescription Yes   Weight 4 lbs   Reps 10-15      Perform Capillary Blood Glucose checks as needed.  Exercise Prescription Changes:     Exercise Prescription Changes    Row Name 11/19/16 1400 11/23/16 0900 11/27/16 1200 12/11/16 1500 12/27/16 1100     Response to Exercise   Blood Pressure (Admit) 112/64  - 102/62  - 134/64   Blood Pressure (Exercise) 164/66  - 124/74 130/80 142/72   Blood Pressure (Exit) 128/70  - 128/70 138/60 104/62   Heart Rate (Admit) 45 bpm  - 61 bpm 38 bpm  sent note to Dr about bradycardia 65 bpm   Heart Rate (Exercise) 77 bpm  - 109 bpm 108 bpm 114 bpm   Heart Rate (Exit) 47 bpm  - 58 bpm 76 bpm 60 bpm   Oxygen Saturation (Admit) 99 %  -  -  -  -   Oxygen Saturation (Exercise) 100 %  -  -  -  -   Rating of Perceived Exertion (Exercise) 7  - _0 Symptoms none  - none none none   Comments walk test results  -  -  - rhythm change during education   Duration  -  - Continue with 45 min of aerobic exercise without signs/symptoms of physical distress. Continue with 45 min of aerobic exercise without signs/symptoms of physical distress. Continue with 45 min of aerobic exercise without signs/symptoms of physical distress.   Intensity  -  - THRR unchanged THRR unchanged THRR unchanged     Progression   Progression  -  - Continue to progress workloads to maintain intensity without signs/symptoms of physical  distress. Continue to progress workloads to maintain intensity without signs/symptoms of physical distress. Continue to progress workloads to maintain intensity without  signs/symptoms of physical distress.   Average METs  -  - 5.82 5.61 8.9     Resistance Training   Training Prescription  -  - Yes Yes Yes   Weight  -  - 4 lbs 4 lb 10 lbs   Reps  -  - 10-15 10-15 10-15     Interval Training   Interval Training  -  - No No Yes   Equipment  -  -  -  - Treadmill   Comments  -  -  -  - 2 min off 30 sec on     Treadmill   MPH  -  - 3.5 3.5 3.7   Grade  -  - _0 Minutes  -  - _1 METs  -  - 4.64 5.61 8.9     Elliptical   Level  -  - 1  - 5.4   Speed  -  - 5.6  - 5.8   Minutes  -  - 15  - 30     REL-XR   Level  -  - 10 15  -   Minutes  -  - 15 15  -   METs  -  - 7  -  -     Home Exercise Plan   Plans to continue exercise at  - [employer] (comment)  [facility] [place] (comment)  [facility] [place] (comment)  [employer]   Frequency  - Add 3 additional days to program exercise sessions. Add 3 additional days to program exercise sessions.  - Add 3 additional days to program exercise sessions.   Initial Home Exercises Provided  - 11/23/16 11/23/16  - 11/23/16   Row Name 01/11/17 0900             Response to Exercise   Blood Pressure (Admit) 110/74       Blood Pressure (Exercise) 112/78       Blood Pressure (Exit) 110/78       Heart Rate (Admit) 58 bpm       Heart Rate (Exercise) 115 bpm       Heart Rate (Exit) 71 bpm       Rating of Perceived Exertion (Exercise) 13       Symptoms none       Duration Continue with 45 min of aerobic exercise without signs/symptoms of physical distress.       Intensity THRR unchanged         Progression   Progression Continue to progress workloads to maintain intensity without signs/symptoms of physical distress.       Average METs 8.9         Resistance Training   Training  Prescription Yes       Weight 10 lbs       Reps 10-15         Interval Training   Interval Training Yes       Equipment Treadmill       Comments 2 min off 30 sec on         Treadmill   MPH 3.7       Grade 10       Minutes 15       METs 8.9         Elliptical   Level 5.4       Speed 5.8  Minutes 30         [place] to continue exercise at [facility] (comment)  [employer]       Frequency Add 3 additional days to program exercise sessions.       Initial Home Exercises Provided 11/23/16          Exercise Comments:     Exercise Comments    Row Name 11/21/16 [phone] 11/23/16 0926 11/28/16 0756       Exercise Comments  First full day of exercise!  Patient was oriented to gym and equipment including functions, settings, policies, and procedures.  Patient's individual exercise prescription and treatment plan were reviewed.  All starting workloads were established based on the results of the 6 minute walk test done at initial orientation visit.  The plan for exercise progression was also introduced and progression will be customized based on patient's performance and goals. Reviewed home exercise with pt today.  Pt plans to walk and go to [employer] with his wife for exercise.  Reviewed THR, pulse, RPE, sign and symptoms, and when to call 911 or MD.  Also discussed weather considerations and indoor options.  Pt voiced understanding. Reviewed METs average and discussed progression with pt today.        Exercise Goals and Review:     Exercise Goals    Row Name 11/19/16 1427             Exercise Goals   Increase Physical Activity Yes       Intervention Provide advice, education, support and counseling about physical activity/exercise needs.;Develop an individualized exercise prescription for aerobic and resistive training based on initial evaluation findings, risk stratification, comorbidities and participant's personal goals.        Expected Outcomes Achievement of increased cardiorespiratory fitness and enhanced flexibility, muscular endurance and strength shown through measurements of functional capacity and personal statement of participant.       Increase Strength and Stamina Yes       Intervention Provide advice, education, support and counseling about physical activity/exercise needs.;Develop an individualized exercise prescription for aerobic and resistive training based on initial evaluation findings, risk stratification, comorbidities and participant's personal goals.       Expected Outcomes Achievement of increased cardiorespiratory fitness and enhanced flexibility, muscular endurance and strength shown through measurements of functional capacity and personal statement of participant.          Exercise Goals Re-Evaluation :     Exercise Goals Re-Evaluation    Row Name 11/23/16 0932 11/27/16 1220 12/07/16 0839 12/11/16 1555 12/27/16 1143     Exercise Goal Re-Evaluation   Exercise Goals Review Increase Physical Activity;Increase Strenth and Stamina Increase Physical Activity;Increase Strenth and Stamina Increase Physical Activity;Increase Strenth and Stamina Increase Physical Activity;Increase Strenth and Stamina Increase Physical Activity;Increase Strenth and Stamina   Comments Reviewed home exercise with pt today.  Pt plans to walk and go to [employer] with his wife for exercise.  Reviewed THR, pulse, RPE, sign and symptoms, and when to call 911 or MD.  Also discussed weather considerations and indoor options.  Pt voiced understanding. William Bates is off to a good start in rehab.  He really wants to work hard and has requested to do the elliptical for 79mn versus switching to a seated machine.  He is also interested in starting intervals after this week.  We will continue to monitor his progression. SRichardson Landryis feeling better.  He has more strength  and stamina and no longer has rounds of shortness of breath.  He is going  to the gym 3 days a week and works on the treadmill.  He has also started to add some weights into his routine. William Bates has increased incline on Tm and resistance on XR. Staff willc ontinue to monitor progression. William Bates continues to do well in rehab.  He is now up to 5.8 speed on the ellipitical and has added in intervals on the treadmill.  We will continue to monitor his progression.   Expected Outcomes Short: Start going back to gym.  Long: Make exercise part of routine. Short: Add in intervals.  Long: Continue to exercise daily. Short: Continue to exercise at home and add different pieces in.  Long: Gain confidence in exercise. Short - attend class on a regular basis Long - Improve overall functional fitness level Short: Continue to work on intervals in rehab.  Long: Continue to exercise independently at gym.   [place] Name 12/28/16 5638 01/09/17 0839 01/11/17 0922         Exercise Goal Re-Evaluation   Exercise Goals Review Increase Physical Activity;Increase Strenth and Stamina Increase Physical Activity;Increase Strenth and Stamina Increase Physical Activity;Increase Strenth and Stamina     Comments William Bates has been going to the gym at least three days a week.  He likes that he is able to push harder now at the gym, but still maintains working out hardest while he is here with [place].  He is enjoying the intervals and has even started to add running into some of them.  William Bates tried intervals on the ellipitcal today and really felt they worked him hard.  He is committed to completing the program because it is helping him build his confidence back up. William Bates improved his post 6MWT by 12.6%! William Bates has been doing well in rehab.  He is scheduled to graduate next week.  He plans to continue to exercise at [employer]     Expected Outcomes Short: Continue to work on intervals.  Long: Continue to exercise independently  - Short: Graduate!  Long: Continue to exercise independently.        Discharge Exercise  Prescription (Final Exercise Prescription Changes):     Exercise Prescription Changes - 01/11/17 0900      Response to Exercise   Blood Pressure (Admit) 110/74   Blood Pressure (Exercise) 112/78   Blood Pressure (Exit) 110/78   Heart Rate (Admit) 58 bpm   Heart Rate (Exercise) 115 bpm   Heart Rate (Exit) 71 bpm   Rating of Perceived Exertion (Exercise) 13   Symptoms none   Duration Continue with 45 min of aerobic exercise without signs/symptoms of physical distress.   Intensity THRR unchanged     Progression   Progression Continue to progress workloads to maintain intensity without signs/symptoms of physical distress.   Average METs 8.9     Resistance Training   Training Prescription Yes   Weight 10 lbs   Reps 10-15     Interval Training   Interval Training Yes   Equipment Treadmill   Comments 2 min off 30 sec on     Treadmill   MPH 3.7   Grade 10   Minutes 15   METs 8.9     Elliptical   Level 5.4   Speed 5.8   Minutes 30     Home Exercise Plan   Plans to continue exercise at [facility] (comment)  [employer]   Frequency Add  3 additional days to program exercise sessions.   Initial Home Exercises Provided 11/23/16      Nutrition:  Target Goals: Understanding of nutrition guidelines, daily intake of sodium <1567m, cholesterol <2053m calories 30% from fat and 7% or less from saturated fats, daily to have 5 or more servings of fruits and vegetables.  Biometrics:     Pre Biometrics - 11/19/16 1427      Pre Biometrics   Height 6' 0.9" (1.852 m)   Weight 250 lb 4.8 oz (113.5 kg)   Waist Circumference 41 inches   Hip Circumference 42 inches   Waist to Hip Ratio 0.98 %   BMI (Calculated) 33.2   Single Leg Stand 30 seconds         Post Biometrics - 01/09/17 0839       Post  Biometrics   Height 6' 0.9" (1.852 m)   Weight 242 lb (109.8 kg)   Waist Circumference 40 inches   Hip Circumference 42 inches   Waist to Hip Ratio 0.95 %   BMI  (Calculated) 32.1   Single Leg Stand 30 seconds      Nutrition Therapy Plan and Nutrition Goals:     Nutrition Therapy & Goals - 12/04/16 0835      Nutrition Therapy   Diet Instructed on a meal plan based on 2000 calories and heart healthy dietary principles.   Drug/Food Interactions Statins/Certain Fruits   Protein (specify units) 8   Fiber 30 grams   Whole Grain Foods 3 servings   Saturated Fats 14 max. grams   Fruits and Vegetables 5 servings/day  8 is optimal   Sodium 2000 grams  1500 is ideal     Personal Nutrition Goals   Nutrition Goal Read labels for saturated and trans fat and sodium.   Personal Goal #2 Balance meals more with at least 2 servings of carbohydrate. Think of food guide plate which  balances carbohydrate, protein and non-starchy vegetables.   Personal Goal #3 Use phone apps such as caloreking.com to look up nutrition informaton especially for foods eaten "out".     Intervention Plan   Intervention Prescribe, educate and counsel regarding individualized specific dietary modifications aiming towards targeted core components such as weight, hypertension, lipid management, diabetes, heart failure and other comorbidities.;Nutrition handout(s) given to patient.   Expected Outcomes Short Term Goal: Understand basic principles of dietary content, such as calories, fat, sodium, cholesterol and nutrients.;Short Term Goal: A plan has been developed with personal nutrition goals set during dietitian appointment.;Long Term Goal: Adherence to prescribed nutrition plan.      Nutrition Discharge: Rate Your Plate Scores:     Nutrition Assessments - 11/19/16 1257      MEDFICTS Scores   Pre Score 29      Nutrition Goals Re-Evaluation:     Nutrition Goals Re-Evaluation    [place] 12/07/16 0836 12/28/16 0958           Goals   Current Weight 242 lb (109.8 kg)  -      Nutrition Goal Read labels for saturated and trans fat and sodium.  Balance meals Read labels  for saturated and trans fat and sodium.  Balance meals      Comment StRichardson Landrynjoyed his appointment with the dietician.  He has already started to make changes to his diet.  He no longer adds any salt to his food.  He is trying to eat a more balanced diet.  He does not want to use the  app and just wants to continue to make better choices. William Bates continues to stick with his diet plan.  He has slipped up some by eating more dark chocolate and drinking less water.  He hopes to get back to it.  He continues to make mostly good choices!      Expected Outcome Short: Continue to make changes in diet.  Long: Eat a heart healhty diet. Short: Drink more water.  Long: Continue heart healthy diet.         Nutrition Goals Discharge (Final Nutrition Goals Re-Evaluation):     Nutrition Goals Re-Evaluation - 12/28/16 7517      Goals   Nutrition Goal Read labels for saturated and trans fat and sodium.  Balance meals   Comment William Bates continues to stick with his diet plan.  He has slipped up some by eating more dark chocolate and drinking less water.  He hopes to get back to it.  He continues to make mostly good choices!   Expected Outcome Short: Drink more water.  Long: Continue heart healthy diet.      Psychosocial: Target Goals: Acknowledge presence or absence of significant depression and/or stress, maximize coping skills, provide positive support system. Participant is able to verbalize types and ability to use techniques and skills needed for reducing stress and depression.   Initial Review & Psychosocial Screening:     Initial Psych Review & Screening - 11/19/16 1252      Screening Interventions   Interventions Encouraged to exercise      Quality of Life Scores:      Quality of Life - 11/19/16 1254      Quality of Life Scores   Health/Function Pre 19.07 %   Socioeconomic Pre 21.07 %   Psych/Spiritual Pre 20.21 %   Family Pre 23 %   GLOBAL Pre 20.21 %      PHQ-9: Recent Review Flowsheet  Data    Depression screen [facility] 2/9 11/19/2016   Decreased Interest 1   Down, Depressed, Hopeless 1   PHQ - 2 Score 2   Altered sleeping 2   Tired, decreased energy 1   Change in appetite 0   Feeling bad or failure about yourself  0   Trouble concentrating 0   Moving slowly or fidgety/restless 0   Suicidal thoughts 0   PHQ-9 Score 5   Difficult doing work/chores Not difficult at all     Interpretation of Total Score  Total Score Depression Severity:  1-4 = Minimal depression, 5-9 = Mild depression, 10-14 = Moderate depression, 15-19 = Moderately severe depression, 20-27 = Severe depression   Psychosocial Evaluation and Intervention:     Psychosocial Evaluation - 12/24/16 0944      Psychosocial Evaluation & Interventions   Interventions Encouraged to exercise with the program and follow exercise prescription;Relaxation education;Stress management education   Comments Counselor met with Mr. Freilich Jensen) today for initial psychosocial evaluation.  He is a 51 year old who had a heart attack and (2) stents on 5/29.  Umberto has a strong support system with a significant other of 10 years and a brother and sister who live locally.  Stalin was pre-diabetic prior to the heart attack and had also recently had knee surgery.  He has had a chronic sleep problem sleeping 3 hours prior to the surgery but it has improved to maybe 5 hours.  Some of this is related to his dog waking him up in the middle of the night.  Counselor brainstormed  with Vicent ways to remedy this.  His appetite is good and his mood has improved since he began exercising.  He admits to a history of depression and anxiety subsequent to insomnia years ago.  He reports this has improved since the heart attack and he is coping better. with consistent exercise. Alphonsus quit smoking the day he had his heart attack and has been able to lose almost 20 pounds which is amazing.  Counselor commended pt on his progress and his determination to  improve his health.  He has some stress related mostly to his significant other's stress with her job and her seasonal depression.  He is coping well with this currently and counselor encouraged ways to approach this with her. Delane has goals to continue to lose weight and lower his cholesterol, etc while in this class.  He will continue to exercise by running or walking his dog and continue to eat healthier as well.      Expected Outcomes Kitt will benefit from consistent exercise to achieve his stated goals.  He also has benefitted from meeting with the dietician to discuss heart health diet to help with his cholesterol numbers decreasing.  The psychoeducational components of this program will be helpful for Newell to learn ways to cope and deal with stress and depressive symptoms in his life and that of his significant other.    Continue Psychosocial Services  Follow up required by staff      Psychosocial Re-Evaluation:     Psychosocial Re-Evaluation    [place] Name 12/07/16 [phone] 12/28/16 1000           Psychosocial Re-Evaluation   Current issues with Current Sleep Concerns;Current Stress Concerns Current Sleep Concerns;Current Stress Concerns      Comments William Bates is doing better mentally.  He no longer sits around thinking about his heart event and his future.  Now he is able to get up and go and not dwell on everything that has happened.  He is feeling better and more positive, he does not want to go on anit-depressants and exercise really seems to be helping.  He has a good support system in his girlfriend, she even came to class and his dietician appointment with him.  He has not been sleeping well as the dog has been waking him up at 2 am.  William Bates is doing well mentally.  Best place he has been since his heart event.  He is feeling good and making big strides forward.  He continues to build his confidence.  He has not been sleeping as well as girlfriend has been traveling more and the dog  continues to wake him up.  His lack of sleep then alters his mood some, making him more likely to get easily frustrated.  He did get quite flustered on Wednesday about his ryhthm issues and event monitor, which really bothered him, but he is trying to get better about it.      Expected Outcomes Short: Continue to exercise daily for endorphins.  Long: Continue to have a positive outlook. Short: Continue to exercise for stress relief.  Long: Try to sleep better.       Interventions Encouraged to attend Cardiac Rehabilitation for the exercise;Stress management education Encouraged to attend Cardiac Rehabilitation for the exercise      Continue Psychosocial Services  Follow up required by staff Follow up required by staff        Initial Review   Source of Stress Concerns  - Chronic  Illness         Psychosocial Discharge (Final Psychosocial Re-Evaluation):     Psychosocial Re-Evaluation - 12/28/16 1000      Psychosocial Re-Evaluation   Current issues with Current Sleep Concerns;Current Stress Concerns   Comments William Bates is doing well mentally.  Best place he has been since his heart event.  He is feeling good and making big strides forward.  He continues to build his confidence.  He has not been sleeping as well as girlfriend has been traveling more and the dog continues to wake him up.  His lack of sleep then alters his mood some, making him more likely to get easily frustrated.  He did get quite flustered on Wednesday about his ryhthm issues and event monitor, which really bothered him, but he is trying to get better about it.   Expected Outcomes Short: Continue to exercise for stress relief.  Long: Try to sleep better.    Interventions Encouraged to attend Cardiac Rehabilitation for the exercise   Continue Psychosocial Services  Follow up required by staff     Initial Review   Source of Stress Concerns Chronic Illness      Vocational Rehabilitation: Provide vocational rehab assistance to  qualifying candidates.   Vocational Rehab Evaluation & Intervention:     Vocational Rehab - 11/19/16 1243      Initial Vocational Rehab Evaluation & Intervention   Assessment shows need for Vocational Rehabilitation No      Education: Education Goals: Education classes will be provided on a weekly basis, covering required topics. Participant will state understanding/return demonstration of topics presented.  Learning Barriers/Preferences:     Learning Barriers/Preferences - 11/19/16 1242      Learning Barriers/Preferences   Learning Barriers None   Learning Preferences Computer/Internet;Individual Instruction      Education Topics: General Nutrition Guidelines/Fats and Fiber: -Group instruction provided by verbal, written material, models and posters to present the general guidelines for heart healthy nutrition. Gives an explanation and review of dietary fats and fiber.   Cardiac Rehab from 01/14/2017 in [facility] Cardiac and Pulmonary Rehab  Date  11/26/16  Educator  CR  Instruction Review Code  2- meets goals/outcomes      Controlling Sodium/Reading Food Labels: -Group verbal and written material supporting the discussion of sodium use in heart healthy nutrition. Review and explanation with models, verbal and written materials for utilization of the food label.   Cardiac Rehab from 01/14/2017 in [facility] Cardiac and Pulmonary Rehab  Date  12/03/16  Educator  CR  Instruction Review Code  2- meets goals/outcomes      Exercise Physiology & Risk Factors: - Group verbal and written instruction with models to review the exercise physiology of the cardiovascular system and associated critical values. Details cardiovascular disease risk factors and the goals associated with each risk factor.   Cardiac Rehab from 01/14/2017 in [facility] Cardiac and Pulmonary Rehab  Date  12/10/16  Educator  [facility]  Instruction Review Code  2- meets goals/outcomes      Aerobic Exercise & Resistance  Training: - Gives group verbal and written discussion on the health impact of inactivity. On the components of aerobic and resistive training programs and the benefits of this training and how to safely progress through these programs.   Cardiac Rehab from 01/14/2017 in [facility] Cardiac and Pulmonary Rehab  Date  12/12/16  Educator  SB  Instruction Review Code  2- meets goals/outcomes      Flexibility, Balance, General Exercise Guidelines: -  Provides group verbal and written instruction on the benefits of flexibility and balance training programs. Provides general exercise guidelines with specific guidelines to those with heart or lung disease. Demonstration and skill practice provided.   Stress Management: - Provides group verbal and written instruction about the health risks of elevated stress, cause of high stress, and healthy ways to reduce stress.   Cardiac Rehab from 01/14/2017 in [facility] Cardiac and Pulmonary Rehab  Date  12/26/16  Educator  [facility]  Instruction Review Code  2- meets goals/outcomes      Depression: - Provides group verbal and written instruction on the correlation between heart/lung disease and depressed mood, treatment options, and the stigmas associated with seeking treatment.   Cardiac Rehab from 01/14/2017 in [facility] Cardiac and Pulmonary Rehab  Date  11/28/16  Educator  [facility]  Instruction Review Code  2- meets goals/outcomes      Anatomy & Physiology of the Heart: - Group verbal and written instruction and models provide basic cardiac anatomy and physiology, with the coronary electrical and arterial systems. Review of: AMI, Angina, Valve disease, Heart Failure, Cardiac Arrhythmia, Pacemakers, and the ICD.   Cardiac Rehab from 01/14/2017 in [facility] Cardiac and Pulmonary Rehab  Date  12/24/16  Educator  CE  Instruction Review Code  2- meets goals/outcomes      Cardiac Procedures: - Group verbal and written instruction and models to describe the testing methods done to  diagnose heart disease. Reviews the outcomes of the test results. Describes the treatment choices: Medical Management, Angioplasty, or Coronary Bypass Surgery.   Cardiac Rehab from 01/14/2017 in [facility] Cardiac and Pulmonary Rehab  Date  12/31/16  Educator  CE  Instruction Review Code  2- meets goals/outcomes      Cardiac Medications: - Group verbal and written instruction to review commonly prescribed medications for heart disease. Reviews the medication, class of the drug, and side effects. Includes the steps to properly store meds and maintain the prescription regimen.   Cardiac Rehab from 01/14/2017 in [facility] Cardiac and Pulmonary Rehab  Date  01/07/17 [01/07/17 Part I 8/8//18 Part 2]  Educator  CE  Instruction Review Code  2- meets goals/outcomes      Go Sex-Intimacy & Heart Disease, Get SMART - Goal Setting: - Group verbal and written instruction through game format to discuss heart disease and the return to sexual intimacy. Provides group verbal and written material to discuss and apply goal setting through the application of the S.M.A.R.T. Method.   Cardiac Rehab from 01/14/2017 in [facility] Cardiac and Pulmonary Rehab  Date  12/31/16  Educator  CE  Instruction Review Code  2- meets goals/outcomes      Other Matters of the Heart: - Provides group verbal, written materials and models to describe Heart Failure, Angina, Valve Disease, and Diabetes in the realm of heart disease. Includes description of the disease process and treatment options available to the cardiac patient.   Cardiac Rehab from 01/14/2017 in [facility] Cardiac and Pulmonary Rehab  Date  12/24/16  Educator  CE  Instruction Review Code  2- meets goals/outcomes      Exercise & Equipment Safety: - Individual verbal instruction and demonstration of equipment use and safety with use of the equipment.   Cardiac Rehab from 01/14/2017 in [facility] Cardiac and Pulmonary Rehab  Date  11/19/16  Educator  [place]  Instruction Review Code   1- partially meets, needs review/practice      Infection Prevention: - Provides verbal and written material to individual  with discussion of infection control including proper hand washing and proper equipment cleaning during exercise session.   Cardiac Rehab from 01/14/2017 in [facility] Cardiac and Pulmonary Rehab  Date  11/19/16  Educator  C. EnterkinRN  Instruction Review Code  2- meets goals/outcomes      Falls Prevention: - Provides verbal and written material to individual with discussion of falls prevention and safety.   Cardiac Rehab from 01/14/2017 in [facility] Cardiac and Pulmonary Rehab  Date  11/19/16  Educator  C. ENterkinRN  Instruction Review Code  2- meets goals/outcomes      Diabetes: - Individual verbal and written instruction to review signs/symptoms of diabetes, desired ranges of glucose level fasting, after meals and with exercise. Advice that pre and post exercise glucose checks will be done for 3 sessions at entry of program.    Knowledge Questionnaire Score:     Knowledge Questionnaire Score - 11/19/16 1242      Knowledge Questionnaire Score   Pre Score 20/28      Core Components/Risk Factors/Patient Goals at Admission:     Personal Goals and Risk Factors at Admission - 11/19/16 1248      Core Components/Risk Factors/Patient Goals on Admission    Weight Management Yes   Intervention Weight Management: Develop a combined nutrition and exercise program designed to reach desired caloric intake, while maintaining appropriate intake of nutrient and fiber, sodium and fats, and appropriate energy expenditure required for the weight goal.;Weight Management: Provide education and appropriate resources to help participant work on and attain dietary goals.;Weight Management/Obesity: Establish reasonable short term and long term weight goals.   Admit Weight 250 lb (113.4 kg)   Goal Weight: Short Term 245 lb (111.1 kg)   Goal Weight: Long Term 230 lb (104.3 kg)    Expected Outcomes Short Term: Continue to assess and modify interventions until short term weight is achieved;Weight Loss: Understanding of general recommendations for a balanced deficit meal plan, which promotes 1-2 lb weight loss per week and includes a negative energy balance of [phone] kcal/d;Understanding recommendations for meals to include 15-35% energy as protein, 25-35% energy from fat, 35-60% energy from carbohydrates, less than 211m of dietary cholesterol, 20-35 gm of total fiber daily   Tobacco Cessation Yes   Number of packs per day Quit smoking 1/4 pack day times 30 years the day he had his heart attack on 10/30/2016   Intervention Offer self-teaching materials, assist with locating and accessing local/national Quit Smoking programs, and support quit date choice.   Expected Outcomes Long Term: Complete abstinence from all tobacco products for at least 12 months from quit date.;Short Term: Will quit all tobacco product use, adhering to prevention of relapse plan.   Hypertension Yes   Intervention Provide education on lifestyle modifcations including regular physical activity/exercise, weight management, moderate sodium restriction and increased consumption of fresh fruit, vegetables, and low fat dairy, alcohol moderation, and smoking cessation.;Monitor prescription use compliance.   Expected Outcomes Short Term: Continued assessment and intervention until BP is < 140/910mHG in hypertensive participants. < 130/8064mG in hypertensive participants with diabetes, heart failure or chronic kidney disease.;Long Term: Maintenance of blood pressure at goal levels.   Lipids Yes   Intervention Provide education and support for participant on nutrition & aerobic/resistive exercise along with prescribed medications to achieve LDL <80m46mDL >40mg7mExpected Outcomes Short Term: Participant states understanding of desired cholesterol values and is compliant with medications prescribed. Participant is  following exercise prescription and nutrition guidelines.;Long Term:  Cholesterol controlled with medications as prescribed, with individualized exercise RX and with personalized nutrition plan. Value goals: LDL < 77m, HDL > 40 mg.   Stress Yes   Intervention Offer individual and/or small group education and counseling on adjustment to heart disease, stress management and health-related lifestyle change. Teach and support self-help strategies.;Refer participants experiencing significant psychosocial distress to appropriate mental health specialists for further evaluation and treatment. When possible, include family members and significant others in education/counseling sessions.   Expected Outcomes Short Term: Participant demonstrates changes in health-related behavior, relaxation and other stress management skills, ability to obtain effective social support, and compliance with psychotropic medications if prescribed.;Long Term: Emotional wellbeing is indicated by absence of clinically significant psychosocial distress or social isolation.      Core Components/Risk Factors/Patient Goals Review:      Goals and Risk Factor Review    Row Name 12/07/16 0841 12/28/16 0954           Core Components/Risk Factors/Patient Goals Review   Personal Goals Review Weight Management/Obesity;Tobacco Cessation;Hypertension;Lipids Weight Management/Obesity;Tobacco Cessation;Hypertension;Lipids      Review Steve's weight is down to 242 lbs!  He has already met his short term goal for weight loss!!  He has stayed away from the cigarrettes!!  He is feeling better and gaining confidence everyday.  His blood pressure was up today, but overall has been going down.  He has his lipids checked recently and they have greatly improved!!  He is hoping that when he goes in for his appointment in two weeks they may back off his lipitor.  He has his echo scheduled for 7/16! He really is looking forward to getting rid of the Life  Vest!! SRichardson Landrycontinues to lose weight!  No smoking!  Feeling much better!  He said that classes have really helped build up his confidence.  His blood pressures have been good. And he has not had any problems with his medications.  He is has been a holter montior for a few days after an episode on junctional rhythm.  He was disappointed to have to go back on a monitor as he was enjoying his freedom from the LifeVest!      Expected Outcomes Short: Go in for echo and find out more about his lipids.  Long: Continue to work on weight loss and staying smoke free. Short: Continue to work on weight loss.  Long: Continue to work on risk factor modification.         Core Components/Risk Factors/Patient Goals at Discharge (Final Review):      Goals and Risk Factor Review - 12/28/16 0954      Core Components/Risk Factors/Patient Goals Review   Personal Goals Review Weight Management/Obesity;Tobacco Cessation;Hypertension;Lipids   Review SRichardson Landrycontinues to lose weight!  No smoking!  Feeling much better!  He said that classes have really helped build up his confidence.  His blood pressures have been good. And he has not had any problems with his medications.  He is has been a holter montior for a few days after an episode on junctional rhythm.  He was disappointed to have to go back on a monitor as he was enjoying his freedom from the LifeVest!   Expected Outcomes Short: Continue to work on weight loss.  Long: Continue to work on risk factor modification.      ITP Comments:     ITP Comments    Row Name 11/19/16 1244 11/19/16 1250 11/19/16 1252 11/21/16 0652 12/11/16 1447   ITP Comments  ITP Created during Medical Review after Cardiac REhab Informed consent was signed Diagnosis was in Discharge Summary 11/04/2016 Quit smoking 1/4 pack cigarettes /day times 30 years the day he had his heart attack on 10/30/2016 Both of his parents have died. Joshva recently found out that his father when he was 48 had a heart  stent placed after he was in a bad car accident. Darreld is concerned and would like to know if his heart is going to get stronger since he knows he has to wear the Lifevest since he was told in the hospital he had a vfib arrest but he doesnt'want an internal defib implatnet (AICD) 30 day review. Continue with ITP unless directed changes per Medical Director review.  New to program Approximately 30 minutes after check in during education on 12/10/2016 patient's HR decreased to 38 - Sinus Bradycardia.  Patient asymptomatic, alert and oriented.  Upon arrival to Cardiac Rehab HR was in the 70s - NSR.  Noted during the previous sessions patient's HR was 57 and 55 respectively.  Patient on Metoprolol (Toprol XL) 25 mg daily.  A note was faxed to Dr. Saralyn Pilar to make him aware.  Awaiting response from MD.       [place] Name 12/19/16 9093 12/27/16 1144 01/16/17 0609       ITP Comments 30 day review. Continue with ITP unless directed changes per Medical Director review    William Bates had a rhythm change during education on Wednesday.  Strips were sent to office for review.  William Bates was then asked to come in to be put on a holter monitor to review rhythm. 30 day review. Continue with ITP unless directed changes per Medical Director review         Comments:

## 2017-01-16 NOTE — Progress Notes (Signed)
Daily Session Note  Patient Details  Name: William Bates MRN: [id number] Date of Birth: 05-Sep-1965 Referring Provider:     Cardiac Rehab from 11/19/2016 in [facility] Cardiac and Pulmonary Rehab  Referring Provider  Isaias Cowman MD      Encounter Date: 01/16/2017  Check In:     Session Check In - 01/16/17 0738      Check-In   Location [facility]-Cardiac & Pulmonary Rehab   Staff Present Alberteen Sam, MA, ACSM RCEP, Exercise Physiologist;Joseph Flavia Shipper;Heath Lark, RN, BSN, CCRP   Supervising physician immediately available to respond to emergencies See telemetry face sheet for immediately available ER MD   Medication changes reported     No   Fall or balance concerns reported    No   Warm-up and Cool-down Performed on first and last piece of equipment   Resistance Training Performed Yes   VAD Patient? No     Pain Assessment   Currently in Pain? No/denies   Multiple Pain Sites No         History  Smoking Status  . Former Smoker  . Packs/day: 0.50  . Years: 30.00  . Quit date: 10/30/2016  Smokeless Tobacco  . Former User    Comment: Remains Quit 12/14/2016    Goals Met:  Independence with exercise equipment Exercise tolerated well No report of cardiac concerns or symptoms Strength training completed today  Goals Unmet:  Not Applicable  Comments: Pt able to follow exercise prescription today without complaint.  Will continue to monitor for progression.   Dr. Emily Filbert is Medical Director for [place] and [name].

## 2017-01-16 NOTE — Patient Instructions (Signed)
Discharge Instructions  Patient Details  Name: William Bates MRN: [id number] Date of Birth: 1965/12/25 Referring Provider:  Marcina Millard, MD   Number of Visits: 20  Reason for Discharge:  Patient reached a stable level of exercise. Patient independent in their exercise.  Smoking History:  History  Smoking Status  . Former Smoker  . Packs/day: 0.50  . Years: 30.00  . Quit date: 10/30/2016  Smokeless Tobacco  . Former User    Comment: Remains Quit 12/14/2016    Diagnosis:  ST elevation myocardial infarction (STEMI), unspecified artery (HCC)  Status post coronary artery stent placement  Initial Exercise Prescription:     Initial Exercise Prescription - 11/19/16 1400      Date of Initial Exercise RX and Referring Provider   Date 11/19/16   Referring Provider Marcina Millard MD     Treadmill   MPH 3   Grade 1   Minutes 15   METs 3.71     Elliptical   Level 1   Speed 4   Minutes 15     REL-XR   Level 3   Speed 50   Minutes 15   METs 3     Prescription Details   Frequency (times per week) 3   Duration Progress to 45 minutes of aerobic exercise without signs/symptoms of physical distress     Intensity   THRR 40-80% of Max Heartrate 96-145   Ratings of Perceived Exertion 11-13   Perceived Dyspnea 0-4     Progression   Progression Continue to progress workloads to maintain intensity without signs/symptoms of physical distress.     Resistance Training   Training Prescription Yes   Weight 4 lbs   Reps 10-15      Discharge Exercise Prescription (Final Exercise Prescription Changes):     Exercise Prescription Changes - 01/11/17 0900      Response to Exercise   Blood Pressure (Admit) 110/74   Blood Pressure (Exercise) 112/78   Blood Pressure (Exit) 110/78   Heart Rate (Admit) 58 bpm   Heart Rate (Exercise) 115 bpm   Heart Rate (Exit) 71 bpm   Rating of Perceived Exertion (Exercise) 13   Symptoms none   Duration Continue with 45  min of aerobic exercise without signs/symptoms of physical distress.   Intensity THRR unchanged     Progression   Progression Continue to progress workloads to maintain intensity without signs/symptoms of physical distress.   Average METs 8.9     Resistance Training   Training Prescription Yes   Weight 10 lbs   Reps 10-15     Interval Training   Interval Training Yes   Equipment Treadmill   Comments 2 min off 30 sec on     Treadmill   MPH 3.7   Grade 10   Minutes 15   METs 8.9     Elliptical   Level 5.4   Speed 5.8   Minutes 30     Home Exercise Plan   Plans to continue exercise at [facility] (comment)  Full Time Fitness   Frequency Add 3 additional days to program exercise sessions.   Initial Home Exercises Provided 11/23/16      Functional Capacity:     6 Minute Walk    Row Name 11/19/16 1424 01/09/17 0835       6 Minute Walk   Phase Initial Discharge    Distance 1545 feet 1740 feet    Distance % Change  - 12.6 %  195 ft  Walk Time 6 minutes 6 minutes    # of Rest Breaks 0 0    MPH 2.93 3.3    METS 4.09 4.23    RPE 7 7    VO2 Peak 14.31 14.8    Symptoms No No    Resting HR 45 bpm 58 bpm    Resting BP 112/64 110/74    Max Ex. HR 77 bpm 127 bpm    Max Ex. BP 164/66 112/78    2 Minute Post BP 128/70  -       Quality of Life:     Quality of Life - 11/19/16 1254      Quality of Life Scores   Health/Function Pre 19.07 %   Socioeconomic Pre 21.07 %   Psych/Spiritual Pre 20.21 %   Family Pre 23 %   GLOBAL Pre 20.21 %      Personal Goals: Goals established at orientation with interventions provided to work toward goal.     Personal Goals and Risk Factors at Admission - 11/19/16 1248      Core Components/Risk Factors/Patient Goals on Admission    Weight Management Yes   Intervention Weight Management: Develop a combined nutrition and exercise program designed to reach desired caloric intake, while maintaining appropriate intake of  nutrient and fiber, sodium and fats, and appropriate energy expenditure required for the weight goal.;Weight Management: Provide education and appropriate resources to help participant work on and attain dietary goals.;Weight Management/Obesity: Establish reasonable short term and long term weight goals.   Admit Weight 250 lb (113.4 kg)   Goal Weight: Short Term 245 lb (111.1 kg)   Goal Weight: Long Term 230 lb (104.3 kg)   Expected Outcomes Short Term: Continue to assess and modify interventions until short term weight is achieved;Weight Loss: Understanding of general recommendations for a balanced deficit meal plan, which promotes 1-2 lb weight loss per week and includes a negative energy balance of [phone] kcal/d;Understanding recommendations for meals to include 15-35% energy as protein, 25-35% energy from fat, 35-60% energy from carbohydrates, less than 200mg  of dietary cholesterol, 20-35 gm of total fiber daily   Tobacco Cessation Yes   Number of packs per day Quit smoking 1/4 pack day times 30 years the day he had his heart attack on 10/30/2016   Intervention Offer self-teaching materials, assist with locating and accessing local/national Quit Smoking programs, and support quit date choice.   Expected Outcomes Long Term: Complete abstinence from all tobacco products for at least 12 months from quit date.;Short Term: Will quit all tobacco product use, adhering to prevention of relapse plan.   Hypertension Yes   Intervention Provide education on lifestyle modifcations including regular physical activity/exercise, weight management, moderate sodium restriction and increased consumption of fresh fruit, vegetables, and low fat dairy, alcohol moderation, and smoking cessation.;Monitor prescription use compliance.   Expected Outcomes Short Term: Continued assessment and intervention until BP is < 140/3190mm HG in hypertensive participants. < 130/2380mm HG in hypertensive participants with diabetes, heart  failure or chronic kidney disease.;Long Term: Maintenance of blood pressure at goal levels.   Lipids Yes   Intervention Provide education and support for participant on nutrition & aerobic/resistive exercise along with prescribed medications to achieve LDL 70mg , HDL >40mg .   Expected Outcomes Short Term: Participant states understanding of desired cholesterol values and is compliant with medications prescribed. Participant is following exercise prescription and nutrition guidelines.;Long Term: Cholesterol controlled with medications as prescribed, with individualized exercise RX and with personalized nutrition plan. Value  goals: LDL < 70mg , HDL > 40 mg.   Stress Yes   Intervention Offer individual and/or small group education and counseling on adjustment to heart disease, stress management and health-related lifestyle change. Teach and support self-help strategies.;Refer participants experiencing significant psychosocial distress to appropriate mental health specialists for further evaluation and treatment. When possible, include family members and significant others in education/counseling sessions.   Expected Outcomes Short Term: Participant demonstrates changes in health-related behavior, relaxation and other stress management skills, ability to obtain effective social support, and compliance with psychotropic medications if prescribed.;Long Term: Emotional wellbeing is indicated by absence of clinically significant psychosocial distress or social isolation.       Personal Goals Discharge:     Goals and Risk Factor Review - 12/28/16 0954      Core Components/Risk Factors/Patient Goals Review   Personal Goals Review Weight Management/Obesity;Tobacco Cessation;Hypertension;Lipids   Review William Bates continues to lose weight!  No smoking!  Feeling much better!  He said that classes have really helped build up his confidence.  His blood pressures have been good. And he has not had any problems with his  medications.  He is has been a holter montior for a few days after an episode on junctional rhythm.  He was disappointed to have to go back on a monitor as he was enjoying his freedom from the Life[employer]est!   Expected Outcomes Short: Continue to work on weight loss.  Long: Continue to work on risk factor modification.      Nutrition & Weight - Outcomes:     Pre Biometrics - 11/19/16 1427      Pre Biometrics   Height 6' 0.9" (1.852 m)   Weight 250 lb 4.8 oz (113.5 kg)   Waist Circumference 41 inches   Hip Circumference 42 inches   Waist to Hip Ratio 0.98 %   BMI (Calculated) 33.2   Single Leg Stand 30 seconds         Post Biometrics - 01/09/17 0839       Post  Biometrics   Height 6' 0.9" (1.852 m)   Weight 242 lb (109.8 kg)   Waist Circumference 40 inches   Hip Circumference 42 inches   Waist to Hip Ratio 0.95 %   BMI (Calculated) 32.1   Single Leg Stand 30 seconds      Nutrition:     Nutrition Therapy & Goals - 12/04/16 0835      Nutrition Therapy   Diet Instructed on a meal plan based on 2000 calories and heart healthy dietary principles.   Drug/Food Interactions Statins/Certain Fruits   Protein (specify units) 8   Fiber 30 grams   Whole Grain Foods 3 servings   Saturated Fats 14 max. grams   Fruits and Vegetables 5 servings/day  8 is optimal   Sodium 2000 grams  1500 is ideal     Personal Nutrition Goals   Nutrition Goal Read labels for saturated and trans fat and sodium.   Personal Goal #2 Balance meals more with at least 2 servings of carbohydrate. Think of food guide plate which  balances carbohydrate, protein and non-starchy vegetables.   Personal Goal #3 Use phone apps such as caloreking.com to look up nutrition informaton especially for foods eaten "out".     Intervention Plan   Intervention Prescribe, educate and counsel regarding individualized specific dietary modifications aiming towards targeted core components such as weight, hypertension, lipid  management, diabetes, heart failure and other comorbidities.;Nutrition handout(s) given to patient.   Expected  Outcomes Short Term Goal: Understand basic principles of dietary content, such as calories, fat, sodium, cholesterol and nutrients.;Short Term Goal: A plan has been developed with personal nutrition goals set during dietitian appointment.;Long Term Goal: Adherence to prescribed nutrition plan.      Nutrition Discharge:     Nutrition Assessments - 11/19/16 1257      MEDFICTS Scores   Pre Score 29      Education Questionnaire Score:     Knowledge Questionnaire Score - 11/19/16 1242      Knowledge Questionnaire Score   Pre Score 20/28      Goals reviewed with patient; copy given to patient.

## 2017-01-18 ENCOUNTER — Encounter: Payer: 59 | Admitting: *Deleted

## 2017-01-18 DIAGNOSIS — Z955 Presence of coronary angioplasty implant and graft: Secondary | ICD-10-CM

## 2017-01-18 DIAGNOSIS — I213 ST elevation (STEMI) myocardial infarction of unspecified site: Secondary | ICD-10-CM

## 2017-01-18 NOTE — Progress Notes (Signed)
Cardiac Individual Treatment Plan  Patient Details  Name: MERYL PONDER MRN: [id number] Date of Birth: 09-28-1965 Referring Provider:     Cardiac Rehab from 11/19/2016 in [facility] Cardiac and Pulmonary Rehab  Referring Provider  Isaias Cowman MD      Initial Encounter Date:    Cardiac Rehab from 11/19/2016 in [facility] Cardiac and Pulmonary Rehab  Date  11/19/16  Referring Provider  Isaias Cowman MD      Visit Diagnosis: ST elevation myocardial infarction (STEMI), unspecified artery [facility])  Status post coronary artery stent placement  Patient's Home Medications on Admission:  Current Outpatient Prescriptions:  .  acetaminophen (TYLENOL) 500 MG tablet, Take 500 mg by mouth every 6 (six) hours as needed., Disp: , Rfl:  .  aspirin 81 MG chewable tablet, Chew 1 tablet (81 mg total) by mouth daily., Disp: , Rfl:  .  atorvastatin (LIPITOR) 40 MG tablet, Take 1 tablet (40 mg total) by mouth daily at 6 PM., Disp: 30 tablet, Rfl: 0 .  lisinopril (PRINIVIL,ZESTRIL) 2.5 MG tablet, Take 1 tablet (2.5 mg total) by mouth daily., Disp: 30 tablet, Rfl: 0 .  metoprolol succinate (TOPROL-XL) 25 MG 24 hr tablet, Take by mouth., Disp: , Rfl:  .  naproxen sodium (ANAPROX) 220 MG tablet, Take 220 mg by mouth 2 (two) times daily with a meal., Disp: , Rfl:  .  ticagrelor (BRILINTA) 90 MG TABS tablet, Take 1 tablet (90 mg total) by mouth 2 (two) times daily., Disp: 60 tablet, Rfl: 0  Past Medical History: Past Medical History:  Diagnosis Date  . Alcohol abuse   . Pre-diabetes   . Tobacco use     Tobacco Use: History  Smoking Status  . Former Smoker  . Packs/day: 0.50  . Years: 30.00  . Quit date: 10/30/2016  Smokeless Tobacco  . Former User    Comment: Remains Quit 12/14/2016    Labs: Recent Review Flowsheet Data    Labs for ITP Cardiac and Pulmonary Rehab Latest Ref Rng & Units 10/30/2016 10/31/2016 10/31/2016   Cholestrol 0 - 200 mg/dL 202(H) 160 -   LDLCALC 0 - 99 mg/dL 110(H) 103(H)  -   HDL >40 mg/dL 37(L) 30(L) -   Trlycerides <150 mg/dL 276(H) 136 136   Hemoglobin A1c 4.8 - 5.6 % 6.0(H) - -       Exercise Target Goals:    Exercise Program Goal: Individual exercise prescription set with THRR, safety & activity barriers. Participant demonstrates ability to understand and report RPE using BORG scale, to self-measure pulse accurately, and to acknowledge the importance of the exercise prescription.  Exercise Prescription Goal: Starting with aerobic activity 30 plus minutes a day, 3 days per week for initial exercise prescription. Provide home exercise prescription and guidelines that participant acknowledges understanding prior to discharge.  Activity Barriers & Risk Stratification:     Activity Barriers & Cardiac Risk Stratification - 11/19/16 1247      Activity Barriers & Cardiac Risk Stratification   Activity Barriers Arthritis;Joint Problems;Deconditioning;Muscular Weakness;Decreased Ventricular Function  multiple injuries to r knee, meniscus tears and ACL tears   Cardiac Risk Stratification High      6 Minute Walk:     6 Minute Walk    Row Name 11/19/16 1424 01/09/17 0835       6 Minute Walk   Phase Initial Discharge    Distance 1545 feet 1740 feet    Distance % Change  - 12.6 %  195 ft    Walk Time  6 minutes 6 minutes    # of Rest Breaks 0 0    MPH 2.93 3.3    METS 4.09 4.23    RPE 7 7    VO2 Peak 14.31 14.8    Symptoms No No    Resting HR 45 bpm 58 bpm    Resting BP 112/64 110/74    Max Ex. HR 77 bpm 127 bpm    Max Ex. BP 164/66 112/78    2 Minute Post BP 128/70  -       Oxygen Initial Assessment:   Oxygen Re-Evaluation:   Oxygen Discharge (Final Oxygen Re-Evaluation):   Initial Exercise Prescription:     Initial Exercise Prescription - 11/19/16 1400      Date of Initial Exercise RX and Referring Provider   Date 11/19/16   Referring Provider Paraschos, Alexander MD     Treadmill   MPH 3   Grade 1   Minutes 15    METs 3.71     Elliptical   Level 1   Speed 4   Minutes 15     REL-XR   Level 3   Speed 50   Minutes 15   METs 3     Prescription Details   Frequency (times per week) 3   Duration Progress to 45 minutes of aerobic exercise without signs/symptoms of physical distress     Intensity   THRR 40-80% of Max Heartrate 96-145   Ratings of Perceived Exertion 11-13   Perceived Dyspnea 0-4     Progression   Progression Continue to progress workloads to maintain intensity without signs/symptoms of physical distress.     Resistance Training   Training Prescription Yes   Weight 4 lbs   Reps 10-15      Perform Capillary Blood Glucose checks as needed.  Exercise Prescription Changes:     Exercise Prescription Changes    Row Name 11/19/16 1400 11/23/16 0900 11/27/16 1200 12/11/16 1500 12/27/16 1100     Response to Exercise   Blood Pressure (Admit) 112/64  - 102/62  - 134/64   Blood Pressure (Exercise) 164/66  - 124/74 130/80 142/72   Blood Pressure (Exit) 128/70  - 128/70 138/60 104/62   Heart Rate (Admit) 45 bpm  - 61 bpm 38 bpm  sent note to Dr about bradycardia 65 bpm   Heart Rate (Exercise) 77 bpm  - 109 bpm 108 bpm 114 bpm   Heart Rate (Exit) 47 bpm  - 58 bpm 76 bpm 60 bpm   Oxygen Saturation (Admit) 99 %  -  -  -  -   Oxygen Saturation (Exercise) 100 %  -  -  -  -   Rating of Perceived Exertion (Exercise) 7  - _0 Symptoms none  - none none none   Comments walk test results  -  -  - rhythm change during education   Duration  -  - Continue with 45 min of aerobic exercise without signs/symptoms of physical distress. Continue with 45 min of aerobic exercise without signs/symptoms of physical distress. Continue with 45 min of aerobic exercise without signs/symptoms of physical distress.   Intensity  -  - THRR unchanged THRR unchanged THRR unchanged     Progression   Progression  -  - Continue to progress workloads to maintain intensity without signs/symptoms of physical  distress. Continue to progress workloads to maintain intensity without signs/symptoms of physical distress. Continue to progress workloads to maintain intensity without  signs/symptoms of physical distress.   Average METs  -  - 5.82 5.61 8.9     Resistance Training   Training Prescription  -  - Yes Yes Yes   Weight  -  - 4 lbs 4 lb 10 lbs   Reps  -  - 10-15 10-15 10-15     Interval Training   Interval Training  -  - No No Yes   Equipment  -  -  -  - Treadmill   Comments  -  -  -  - 2 min off 30 sec on     Treadmill   MPH  -  - 3.5 3.5 3.7   Grade  -  - _0 Minutes  -  - _1 METs  -  - 4.64 5.61 8.9     Elliptical   Level  -  - 1  - 5.4   Speed  -  - 5.6  - 5.8   Minutes  -  - 15  - 30     REL-XR   Level  -  - 10 15  -   Minutes  -  - 15 15  -   METs  -  - 7  -  -     Home Exercise Plan   Plans to continue exercise at  - [employer] (comment)  [employer] [place] (comment)  [facility] [place] (comment)  [employer]   Frequency  - Add 3 additional days to program exercise sessions. Add 3 additional days to program exercise sessions.  - Add 3 additional days to program exercise sessions.   Initial Home Exercises Provided  - 11/23/16 11/23/16  - 11/23/16   Row Name 01/11/17 0900             Response to Exercise   Blood Pressure (Admit) 110/74       Blood Pressure (Exercise) 112/78       Blood Pressure (Exit) 110/78       Heart Rate (Admit) 58 bpm       Heart Rate (Exercise) 115 bpm       Heart Rate (Exit) 71 bpm       Rating of Perceived Exertion (Exercise) 13       Symptoms none       Duration Continue with 45 min of aerobic exercise without signs/symptoms of physical distress.       Intensity THRR unchanged         Progression   Progression Continue to progress workloads to maintain intensity without signs/symptoms of physical distress.       Average METs 8.9         Resistance Training   Training  Prescription Yes       Weight 10 lbs       Reps 10-15         Interval Training   Interval Training Yes       Equipment Treadmill       Comments 2 min off 30 sec on         Treadmill   MPH 3.7       Grade 10       Minutes 15       METs 8.9         Elliptical   Level 5.4       Speed 5.8  Minutes 30         [place] to continue exercise at [facility] (comment)  [employer]       Frequency Add 3 additional days to program exercise sessions.       Initial Home Exercises Provided 11/23/16          Exercise Comments:     Exercise Comments    Row Name 11/21/16 [phone] 11/23/16 0926 11/28/16 0756 01/18/17 0823     Exercise Comments  First full day of exercise!  Patient was oriented to gym and equipment including functions, settings, policies, and procedures.  Patient's individual exercise prescription and treatment plan were reviewed.  All starting workloads were established based on the results of the 6 minute walk test done at initial orientation visit.  The plan for exercise progression was also introduced and progression will be customized based on patient's performance and goals. Reviewed home exercise with pt today.  Pt plans to walk and go to [employer] with his wife for exercise.  Reviewed THR, pulse, RPE, sign and symptoms, and when to call 911 or MD.  Also discussed weather considerations and indoor options.  Pt voiced understanding. Reviewed METs average and discussed progression with pt today. Arleigh graduated today from cardiac rehab with 35 sessions completed.  Details of the patient's exercise prescription and what He needs to do in order to continue the prescription and progress were discussed with patient.  Patient was given a copy of prescription and goals.  Patient verbalized understanding.  Ember plans to continue to exercise by exercising at home and continuing going to his gym.       Exercise Goals and Review:      Exercise Goals    Row Name 11/19/16 1427             Exercise Goals   Increase Physical Activity Yes       Intervention Provide advice, education, support and counseling about physical activity/exercise needs.;Develop an individualized exercise prescription for aerobic and resistive training based on initial evaluation findings, risk stratification, comorbidities and participant's personal goals.       Expected Outcomes Achievement of increased cardiorespiratory fitness and enhanced flexibility, muscular endurance and strength shown through measurements of functional capacity and personal statement of participant.       Increase Strength and Stamina Yes       Intervention Provide advice, education, support and counseling about physical activity/exercise needs.;Develop an individualized exercise prescription for aerobic and resistive training based on initial evaluation findings, risk stratification, comorbidities and participant's personal goals.       Expected Outcomes Achievement of increased cardiorespiratory fitness and enhanced flexibility, muscular endurance and strength shown through measurements of functional capacity and personal statement of participant.          Exercise Goals Re-Evaluation :     Exercise Goals Re-Evaluation    Row Name 11/23/16 4388 11/27/16 1220 12/07/16 0839 12/11/16 1555 12/27/16 1143     Exercise Goal Re-Evaluation   Exercise Goals Review Increase Physical Activity;Increase Strenth and Stamina Increase Physical Activity;Increase Strenth and Stamina Increase Physical Activity;Increase Strenth and Stamina Increase Physical Activity;Increase Strenth and Stamina Increase Physical Activity;Increase Strenth and Stamina   Comments Reviewed home exercise with pt today.  Pt plans to walk and go to [employer] with his wife for exercise.  Reviewed THR, pulse, RPE, sign and symptoms, and when to call 911 or MD.  Also discussed weather considerations and  indoor  options.  Pt voiced understanding. Richardson Landry is off to a good start in rehab.  He really wants to work hard and has requested to do the elliptical for 87mn versus switching to a seated machine.  He is also interested in starting intervals after this week.  We will continue to monitor his progression. SRichardson Landryis feeling better.  He has more strength and stamina and no longer has rounds of shortness of breath.  He is going to the gym 3 days a week and works on the treadmill.  He has also started to add some weights into his routine. SRichardson Landryhas increased incline on Tm and resistance on XR. Staff willc ontinue to monitor progression. SRichardson Landrycontinues to do well in rehab.  He is now up to 5.8 speed on the ellipitical and has added in intervals on the treadmill.  We will continue to monitor his progression.   Expected Outcomes Short: Start going back to gym.  Long: Make exercise part of routine. Short: Add in intervals.  Long: Continue to exercise daily. Short: Continue to exercise at home and add different pieces in.  Long: Gain confidence in exercise. Short - attend class on a regular basis Long - Improve overall functional fitness level Short: Continue to work on intervals in rehab.  Long: Continue to exercise independently at gym.   [place] 12/28/16 [id number]/08/18 0839 01/11/17 0922         Exercise Goal Re-Evaluation   Exercise Goals Review Increase Physical Activity;Increase Strenth and Stamina Increase Physical Activity;Increase Strenth and Stamina Increase Physical Activity;Increase Strenth and Stamina     Comments SRichardson Landryhas been going to the gym at least three days a week.  He likes that he is able to push harder now at the gym, but still maintains working out hardest while [place] is here [place]  He is enjoying the intervals and has even started to add running into some of them.  SRichardson Landrytried intervals on the ellipitcal today and really felt they worked him hard.  He is committed to completing the program because  it is helping him build his confidence back up. SRichardson Landryimproved his post 6MWT by 12.6%! SRichardson Landryhas been doing well in rehab.  He is scheduled to graduate next week.  He plans to continue to exercise at [employer]     Expected Outcomes Short: Continue to work on intervals.  Long: Continue to exercise independently  - Short: Graduate!  Long: Continue to exercise independently.        Discharge Exercise Prescription (Final Exercise Prescription Changes):     Exercise Prescription Changes - 01/11/17 0900      Response to Exercise   Blood Pressure (Admit) 110/74   Blood Pressure (Exercise) 112/78   Blood Pressure (Exit) 110/78   Heart Rate (Admit) 58 bpm   Heart Rate (Exercise) 115 bpm   Heart Rate (Exit) 71 bpm   Rating of Perceived Exertion (Exercise) 13   Symptoms none   Duration Continue with 45 min of aerobic exercise without signs/symptoms of physical distress.   Intensity THRR unchanged     Progression   Progression Continue to progress workloads to maintain intensity without signs/symptoms of physical distress.   Average METs 8.9     Resistance Training   Training Prescription Yes   Weight 10 lbs   Reps 10-15     Interval Training   Interval Training Yes   Equipment Treadmill   Comments 2 min off 30  sec on     Treadmill   MPH 3.7   Grade 10   Minutes 15   METs 8.9     Elliptical   Level 5.4   Speed 5.8   Minutes 30     Home Exercise Plan   Plans to continue exercise at [facility] (comment)  [employer]   Frequency Add 3 additional days to program exercise sessions.   Initial Home Exercises Provided 11/23/16      Nutrition:  Target Goals: Understanding of nutrition guidelines, daily intake of sodium <1562m, cholesterol <2055m calories 30% from fat and 7% or less from saturated fats, daily to have 5 or more servings of fruits and vegetables.  Biometrics:     Pre Biometrics - 11/19/16 1427      Pre Biometrics   Height 6' 0.9" (1.852  m)   Weight 250 lb 4.8 oz (113.5 kg)   Waist Circumference 41 inches   Hip Circumference 42 inches   Waist to Hip Ratio 0.98 %   BMI (Calculated) 33.2   Single Leg Stand 30 seconds         Post Biometrics - 01/09/17 0839       Post  Biometrics   Height 6' 0.9" (1.852 m)   Weight 242 lb (109.8 kg)   Waist Circumference 40 inches   Hip Circumference 42 inches   Waist to Hip Ratio 0.95 %   BMI (Calculated) 32.1   Single Leg Stand 30 seconds      Nutrition Therapy Plan and Nutrition Goals:     Nutrition Therapy & Goals - 12/04/16 0835      Nutrition Therapy   Diet Instructed on a meal plan based on 2000 calories and heart healthy dietary principles.   Drug/Food Interactions Statins/Certain Fruits   Protein (specify units) 8   Fiber 30 grams   Whole Grain Foods 3 servings   Saturated Fats 14 max. grams   Fruits and Vegetables 5 servings/day  8 is optimal   Sodium 2000 grams  1500 is ideal     Personal Nutrition Goals   Nutrition Goal Read labels for saturated and trans fat and sodium.   Personal Goal #2 Balance meals more with at least 2 servings of carbohydrate. Think of food guide plate which  balances carbohydrate, protein and non-starchy vegetables.   Personal Goal #3 Use phone apps such as caloreking.com to look up nutrition informaton especially for foods eaten "out".     Intervention Plan   Intervention Prescribe, educate and counsel regarding individualized specific dietary modifications aiming towards targeted core components such as weight, hypertension, lipid management, diabetes, heart failure and other comorbidities.;Nutrition handout(s) given to patient.   Expected Outcomes Short Term Goal: Understand basic principles of dietary content, such as calories, fat, sodium, cholesterol and nutrients.;Short Term Goal: A plan has been developed with personal nutrition goals set during dietitian appointment.;Long Term Goal: Adherence to prescribed nutrition plan.       Nutrition Discharge: Rate Your Plate Scores:     Nutrition Assessments - 01/18/17 0820      MEDFICTS Scores   Post Score 17      Nutrition Goals Re-Evaluation:     Nutrition Goals Re-Evaluation    Ro[place]ename 12/07/16 0836 12/28/16 0958           Goals   Current Weight 242 lb (109.8 kg)  -      Nutrition Goal Read labels for saturated and trans fat and sodium.  Balance  meals Read labels for saturated and trans fat and sodium.  Balance meals      Comment Richardson Landry enjoyed his appointment with the dietician.  He has already started to make changes to his diet.  He no longer adds any salt to his food.  He is trying to eat a more balanced diet.  He does not want to use the app and just wants to continue to make better choices. Richardson Landry continues to stick with his diet plan.  He has slipped up some by eating more dark chocolate and drinking less water.  He hopes to get back to it.  He continues to make mostly good choices!      Expected Outcome Short: Continue to make changes in diet.  Long: Eat a heart healhty diet. Short: Drink more water.  Long: Continue heart healthy diet.         Nutrition Goals Discharge (Final Nutrition Goals Re-Evaluation):     Nutrition Goals Re-Evaluation - 12/28/16 0998      Goals   Nutrition Goal Read labels for saturated and trans fat and sodium.  Balance meals   Comment Richardson Landry continues to stick with his diet plan.  He has slipped up some by eating more dark chocolate and drinking less water.  He hopes to get back to it.  He continues to make mostly good choices!   Expected Outcome Short: Drink more water.  Long: Continue heart healthy diet.      Psychosocial: Target Goals: Acknowledge presence or absence of significant depression and/or stress, maximize coping skills, provide positive support system. Participant is able to verbalize types and ability to use techniques and skills needed for reducing stress and depression.   Initial Review & Psychosocial  Screening:     Initial Psych Review & Screening - 11/19/16 1252      Screening Interventions   Interventions Encouraged to exercise      Quality of Life Scores:      Quality of Life - 01/18/17 0818      Quality of Life Scores   Health/Function Post 26.4 %   Socioeconomic Post 25.86 %   Psych/Spiritual Post 23.07 %   Family Post 27 %   GLOBAL Post 25.65 %      PHQ-9: Recent Review Flowsheet Data    Depression screen [facility] 2/9 01/18/2017 11/19/2016   Decreased Interest 0 1   Down, Depressed, Hopeless 0 1   PHQ - 2 Score 0 2   Altered sleeping 1 2   Tired, decreased energy 1 1   Change in appetite 1 0   Feeling bad or failure about yourself  - 0   Trouble concentrating 0 0   Moving slowly or fidgety/restless 0 0   Suicidal thoughts 0 0   PHQ-9 Score 3 5   Difficult doing work/chores Not difficult at all Not difficult at all     Interpretation of Total Score  Total Score Depression Severity:  1-4 = Minimal depression, 5-9 = Mild depression, 10-14 = Moderate depression, 15-19 = Moderately severe depression, 20-27 = Severe depression   Psychosocial Evaluation and Intervention:     Psychosocial Evaluation - 12/24/16 0944      Psychosocial Evaluation & Interventions   Interventions Encouraged to exercise with the program and follow exercise prescription;Relaxation education;Stress management education   Comments Counselor met with Mr. Carandang Baumler) today for initial psychosocial evaluation.  He is a 51 year old who had a heart attack and (2) stents on 5/29.  Keionte has a strong support system with a significant other of 10 years and a brother and sister who live locally.  Harley was pre-diabetic prior to the heart attack and had also recently had knee surgery.  He has had a chronic sleep problem sleeping 3 hours prior to the surgery but it has improved to maybe 5 hours.  Some of this is related to his dog waking him up in the middle of the night.  Counselor brainstormed with  Allison ways to remedy this.  His appetite is good and his mood has improved since he began exercising.  He admits to a history of depression and anxiety subsequent to insomnia years ago.  He reports this has improved since the heart attack and he is coping better. with consistent exercise. Rondarius quit smoking the day he had his heart attack and has been able to lose almost 20 pounds which is amazing.  Counselor commended pt on his progress and his determination to improve his health.  He has some stress related mostly to his significant other's stress with her job and her seasonal depression.  He is coping well with this currently and counselor encouraged ways to approach this with her. Kentrell has goals to continue to lose weight and lower his cholesterol, etc while in this class.  He will continue to exercise by running or walking his dog and continue to eat healthier as well.      Expected Outcomes Jonas will benefit from consistent exercise to achieve his stated goals.  He also has benefitted from meeting with the dietician to discuss heart health diet to help with his cholesterol numbers decreasing.  The psychoeducational components of this program will be helpful for Alper to learn ways to cope and deal with stress and depressive symptoms in his life and that of his significant other.    Continue Psychosocial Services  Follow up required by staff      Psychosocial Re-Evaluation:     Psychosocial Re-Evaluation    [place] Name 12/07/16 [id number] 12/28/16 1000           Psychosocial Re-Evaluation   Current issues with Current Sleep Concerns;Current Stress Concerns Current Sleep Concerns;Current Stress Concerns      Comments Richardson Landry is doing better mentally.  He no longer sits around thinking about his heart event and his future.  Now he is able to get up and go and not dwell on everything that has happened.  He is feeling better and more positive, he does not want to go on anit-depressants and exercise  really seems to be helping.  He has a good support system in his girlfriend, she even came to class and his dietician appointment with him.  He has not been sleeping well as the dog has been waking him up at 2 am.  Richardson Landry is doing well mentally.  Best place he has been since his heart event.  He is feeling good and making big strides forward.  He continues to build his confidence.  He has not been sleeping as well as girlfriend has been traveling more and the dog continues to wake him up.  His lack of sleep then alters his mood some, making him more likely to get easily frustrated.  He did get quite flustered on Wednesday about his ryhthm issues and event monitor, which really bothered him, but he is trying to get better about it.      Expected Outcomes Short: Continue to exercise daily for endorphins.  Long:  Continue to have a positive outlook. Short: Continue to exercise for stress relief.  Long: Try to sleep better.       Interventions Encouraged to attend Cardiac Rehabilitation for the exercise;Stress management education Encouraged to attend Cardiac Rehabilitation for the exercise      Continue Psychosocial Services  Follow up required by staff Follow up required by staff        Initial Review   Source of Stress Concerns  - Chronic Illness         Psychosocial Discharge (Final Psychosocial Re-Evaluation):     Psychosocial Re-Evaluation - 12/28/16 1000      Psychosocial Re-Evaluation   Current issues with Current Sleep Concerns;Current Stress Concerns   Comments Richardson Landry is doing well mentally.  Best place he has been since his heart event.  He is feeling good and making big strides forward.  He continues to build his confidence.  He has not been sleeping as well as girlfriend has been traveling more and the dog continues to wake him up.  His lack of sleep then alters his mood some, making him more likely to get easily frustrated.  He did get quite flustered on Wednesday about his ryhthm issues and  event monitor, which really bothered him, but he is trying to get better about it.   Expected Outcomes Short: Continue to exercise for stress relief.  Long: Try to sleep better.    Interventions Encouraged to attend Cardiac Rehabilitation for the exercise   Continue Psychosocial Services  Follow up required by staff     Initial Review   Source of Stress Concerns Chronic Illness      Vocational Rehabilitation: Provide vocational rehab assistance to qualifying candidates.   Vocational Rehab Evaluation & Intervention:     Vocational Rehab - 11/19/16 1243      Initial Vocational Rehab Evaluation & Intervention   Assessment shows need for Vocational Rehabilitation No      Education: Education Goals: Education classes will be provided on a weekly basis, covering required topics. Participant will state understanding/return demonstration of topics presented.  Learning Barriers/Preferences:     Learning Barriers/Preferences - 11/19/16 1242      Learning Barriers/Preferences   Learning Barriers None   Learning Preferences Computer/Internet;Individual Instruction      Education Topics: General Nutrition Guidelines/Fats and Fiber: -Group instruction provided by verbal, written material, models and posters to present the general guidelines for heart healthy nutrition. Gives an explanation and review of dietary fats and fiber.   Cardiac Rehab from 01/14/2017 in [facility] Cardiac and Pulmonary Rehab  Date  11/26/16  Educator  CR  Instruction Review Code  2- meets goals/outcomes      Controlling Sodium/Reading Food Labels: -Group verbal and written material supporting the discussion of sodium use in heart healthy nutrition. Review and explanation with models, verbal and written materials for utilization of the food label.   Cardiac Rehab from 01/14/2017 in [facility] Cardiac and Pulmonary Rehab  Date  12/03/16  Educator  CR  Instruction Review Code  2- meets goals/outcomes      Exercise  Physiology & Risk Factors: - Group verbal and written instruction with models to review the exercise physiology of the cardiovascular system and associated critical values. Details cardiovascular disease risk factors and the goals associated with each risk factor.   Cardiac Rehab from 01/14/2017 in [facility] Cardiac and Pulmonary Rehab  Date  12/10/16  Educator  [facility]  Instruction Review Code  2- meets goals/outcomes  Aerobic Exercise & Resistance Training: - Gives group verbal and written discussion on the health impact of inactivity. On the components of aerobic and resistive training programs and the benefits of this training and how to safely progress through these programs.   Cardiac Rehab from 01/14/2017 in [facility] Cardiac and Pulmonary Rehab  Date  12/12/16  Educator  SB  Instruction Review Code  2- meets goals/outcomes      Flexibility, Balance, General Exercise Guidelines: - Provides group verbal and written instruction on the benefits of flexibility and balance training programs. Provides general exercise guidelines with specific guidelines to those with heart or lung disease. Demonstration and skill practice provided.   Stress Management: - Provides group verbal and written instruction about the health risks of elevated stress, cause of high stress, and healthy ways to reduce stress.   Cardiac Rehab from 01/14/2017 in [facility] Cardiac and Pulmonary Rehab  Date  12/26/16  Educator  [facility]  Instruction Review Code  2- meets goals/outcomes      Depression: - Provides group verbal and written instruction on the correlation between heart/lung disease and depressed mood, treatment options, and the stigmas associated with seeking treatment.   Cardiac Rehab from 01/14/2017 in [facility] Cardiac and Pulmonary Rehab  Date  11/28/16  Educator  [facility]  Instruction Review Code  2- meets goals/outcomes      Anatomy & Physiology of the Heart: - Group verbal and written instruction and models provide basic  cardiac anatomy and physiology, with the coronary electrical and arterial systems. Review of: AMI, Angina, Valve disease, Heart Failure, Cardiac Arrhythmia, Pacemakers, and the ICD.   Cardiac Rehab from 01/14/2017 in [facility] Cardiac and Pulmonary Rehab  Date  12/24/16  Educator  CE  Instruction Review Code  2- meets goals/outcomes      Cardiac Procedures: - Group verbal and written instruction and models to describe the testing methods done to diagnose heart disease. Reviews the outcomes of the test results. Describes the treatment choices: Medical Management, Angioplasty, or Coronary Bypass Surgery.   Cardiac Rehab from 01/14/2017 in [facility] Cardiac and Pulmonary Rehab  Date  12/31/16  Educator  CE  Instruction Review Code  2- meets goals/outcomes      Cardiac Medications: - Group verbal and written instruction to review commonly prescribed medications for heart disease. Reviews the medication, class of the drug, and side effects. Includes the steps to properly store meds and maintain the prescription regimen.   Cardiac Rehab from 01/14/2017 in [facility] Cardiac and Pulmonary Rehab  Date  01/07/17 [01/07/17 Part I 8/8//18 Part 2]  Educator  CE  Instruction Review Code  2- meets goals/outcomes      Go Sex-Intimacy & Heart Disease, Get SMART - Goal Setting: - Group verbal and written instruction through game format to discuss heart disease and the return to sexual intimacy. Provides group verbal and written material to discuss and apply goal setting through the application of the S.M.A.R.T. Method.   Cardiac Rehab from 01/14/2017 in [facility] Cardiac and Pulmonary Rehab  Date  12/31/16  Educator  CE  Instruction Review Code  2- meets goals/outcomes      Other Matters of the Heart: - Provides group verbal, written materials and models to describe Heart Failure, Angina, Valve Disease, and Diabetes in the realm of heart disease. Includes description of the disease process and treatment options available to  the cardiac patient.   Cardiac Rehab from 01/14/2017 in [id number] Cardiac and Pulmonary Rehab  Date  12/24/16  Educator  CE  Instruction Review Code  2- meets goals/outcomes      Exercise & Equipment Safety: - Individual verbal instruction and demonstration of equipment use and safety with use of the equipment.   Cardiac Rehab from 01/14/2017 in [facility] Cardiac and Pulmonary Rehab  Date  11/19/16  Educator  C. EnterkinRN  Instruction Review Code  1- partially meets, needs review/practice      Infection Prevention: - Provides verbal and written material to individual with discussion of infection control including proper hand washing and proper equipment cleaning during exercise session.   Cardiac Rehab from 01/14/2017 in [facility] Cardiac and Pulmonary Rehab  Date  11/19/16  Educator  C. EnterkinRN  Instruction Review Code  2- meets goals/outcomes      Falls Prevention: - Provides verbal and written material to individual with discussion of falls prevention and safety.   Cardiac Rehab from 01/14/2017 in [facility] Cardiac and Pulmonary Rehab  Date  11/19/16  Educator  C. ENterkinRN  Instruction Review Code  2- meets goals/outcomes      Diabetes: - Individual verbal and written instruction to review signs/symptoms of diabetes, desired ranges of glucose level fasting, after meals and with exercise. Advice that pre and post exercise glucose checks will be done for 3 sessions at entry of program.    Knowledge Questionnaire Score:     Knowledge Questionnaire Score - 01/18/17 0818      Knowledge Questionnaire Score   Post Score 26/28      Core Components/Risk Factors/Patient Goals at Admission:     Personal Goals and Risk Factors at Admission - 11/19/16 1248      Core Components/Risk Factors/Patient Goals on Admission    Weight Management Yes   Intervention Weight Management: Develop a combined nutrition and exercise program designed to reach desired caloric intake, while maintaining  appropriate intake of nutrient and fiber, sodium and fats, and appropriate energy expenditure required for the weight goal.;Weight Management: Provide education and appropriate resources to help participant work on and attain dietary goals.;Weight Management/Obesity: Establish reasonable short term and long term weight goals.   Admit Weight 250 lb (113.4 kg)   Goal Weight: Short Term 245 lb (111.1 kg)   Goal Weight: Long Term 230 lb (104.3 kg)   Expected Outcomes Short Term: Continue to assess and modify interventions until short term weight is achieved;Weight Loss: Understanding of general recommendations for a balanced deficit meal plan, which promotes 1-2 lb weight loss per week and includes a negative energy balance of [phone] kcal/d;Understanding recommendations for meals to include 15-35% energy as protein, 25-35% energy from fat, 35-60% energy from carbohydrates, less than 270m of dietary cholesterol, 20-35 gm of total fiber daily   Tobacco Cessation Yes   Number of packs per day Quit smoking 1/4 pack day times 30 years the day he had his heart attack on 10/30/2016   Intervention Offer self-teaching materials, assist with locating and accessing local/national Quit Smoking programs, and support quit date choice.   Expected Outcomes Long Term: Complete abstinence from all tobacco products for at least 12 months from quit date.;Short Term: Will quit all tobacco product use, adhering to prevention of relapse plan.   Hypertension Yes   Intervention Provide education on lifestyle modifcations including regular physical activity/exercise, weight management, moderate sodium restriction and increased consumption of fresh fruit, vegetables, and low fat dairy, alcohol moderation, and smoking cessation.;Monitor prescription use compliance.   Expected Outcomes Short Term: Continued assessment and intervention until BP is < 140/99mHG in hypertensive  participants. < 130/36m HG in hypertensive participants  with diabetes, heart failure or chronic kidney disease.;Long Term: Maintenance of blood pressure at goal levels.   Lipids Yes   Intervention Provide education and support for participant on nutrition & aerobic/resistive exercise along with prescribed medications to achieve LDL <731m HDL >4060m  Expected Outcomes Short Term: Participant states understanding of desired cholesterol values and is compliant with medications prescribed. Participant is following exercise prescription and nutrition guidelines.;Long Term: Cholesterol controlled with medications as prescribed, with individualized exercise RX and with personalized nutrition plan. Value goals: LDL < 26m77mDL > 40 mg.   Stress Yes   Intervention Offer individual and/or small group education and counseling on adjustment to heart disease, stress management and health-related lifestyle change. Teach and support self-help strategies.;Refer participants experiencing significant psychosocial distress to appropriate mental health specialists for further evaluation and treatment. When possible, include family members and significant others in education/counseling sessions.   Expected Outcomes Short Term: Participant demonstrates changes in health-related behavior, relaxation and other stress management skills, ability to obtain effective social support, and compliance with psychotropic medications if prescribed.;Long Term: Emotional wellbeing is indicated by absence of clinically significant psychosocial distress or social isolation.      Core Components/Risk Factors/Patient Goals Review:      Goals and Risk Factor Review    Row Name 12/07/16 0841 12/28/16 0954           Core Components/Risk Factors/Patient Goals Review   Personal Goals Review Weight Management/Obesity;Tobacco Cessation;Hypertension;Lipids Weight Management/Obesity;Tobacco Cessation;Hypertension;Lipids      Review Steve's weight is down to 242 lbs!  He has already met his short  term goal for weight loss!!  He has stayed away from the cigarrettes!!  He is feeling better and gaining confidence everyday.  His blood pressure was up today, but overall has been going down.  He has his lipids checked recently and they have greatly improved!!  He is hoping that when he goes in for his appointment in two weeks they may back off his lipitor.  He has his echo scheduled for 7/16! He really is looking forward to getting rid of the Life Vest!! StevRichardson Landrytinues to lose weight!  No smoking!  Feeling much better!  He said that classes have really helped build up his confidence.  His blood pressures have been good. And he has not had any problems with his medications.  He is has been a holter montior for a few days after an episode on junctional rhythm.  He was disappointed to have to go back on a monitor as he was enjoying his freedom from the LifeVest!      Expected Outcomes Short: Go in for echo and find out more about his lipids.  Long: Continue to work on weight loss and staying smoke free. Short: Continue to work on weight loss.  Long: Continue to work on risk factor modification.         Core Components/Risk Factors/Patient Goals at Discharge (Final Review):      Goals and Risk Factor Review - 12/28/16 0954      Core Components/Risk Factors/Patient Goals Review   Personal Goals Review Weight Management/Obesity;Tobacco Cessation;Hypertension;Lipids   Review StevRichardson Landrytinues to lose weight!  No smoking!  Feeling much better!  He said that classes have really helped build up his confidence.  His blood pressures have been good. And he has not had any problems with his medications.  He is has been a holter montior for a few  days after an episode on junctional rhythm.  He was disappointed to have to go back on a monitor as he was enjoying his freedom from the LifeVest!   Expected Outcomes Short: Continue to work on weight loss.  Long: Continue to work on risk factor modification.      ITP  Comments:     ITP Comments    Row Name 11/19/16 1244 11/19/16 1250 11/19/16 1252 11/21/16 0652 12/11/16 1447   ITP Comments ITP Created during Medical Review after Cardiac REhab Informed consent was signed Diagnosis was in Discharge Summary 11/04/2016 Quit smoking 1/4 pack cigarettes /day times 30 years the day he had his heart attack on 10/30/2016 Both of his parents have died. Christerpher recently found out that his father when he was 16 had a heart stent placed after he was in a bad car accident. Nick is concerned and would like to know if his heart is going to get stronger since he knows he has to wear the Lifevest since he was told in the hospital he had a vfib arrest but he doesnt'want an internal defib implatnet (AICD) 30 day review. Continue with ITP unless directed changes per Medical Director review.  New to program Approximately 30 minutes after check in during education on 12/10/2016 patient's HR decreased to 38 - Sinus Bradycardia.  Patient asymptomatic, alert and oriented.  Upon arrival to Cardiac Rehab HR was in the 70s - NSR.  Noted during the previous sessions patient's HR was 57 and 55 respectively.  Patient on Metoprolol (Toprol XL) 25 mg daily.  A note was faxed to Dr. Saralyn Pilar to make him aware.  Awaiting response from MD.       [place] Name 12/19/16 6270 12/27/16 1144 01/16/17 0609       ITP Comments 30 day review. Continue with ITP unless directed changes per Medical Director review    Richardson Landry had a rhythm change during education on Wednesday.  Strips were sent to office for review.  Richardson Landry was then asked to come in to be put on a holter monitor to review rhythm. 30 day review. Continue with ITP unless directed changes per Medical Director review         Comments: discharge ITP

## 2017-01-18 NOTE — Progress Notes (Signed)
Daily Session Note  Patient Details  Name: William Bates MRN: [id number] Date of Birth: 1966/04/02 Referring Provider:     Cardiac Rehab from 11/19/2016 in [facility] Cardiac and Pulmonary Rehab  Referring Provider  Isaias Cowman MD      Encounter Date: 01/18/2017  Check In:     Session Check In - 01/18/17 0754      Check-In   Location [facility]-Cardiac & Pulmonary Rehab   Staff Present Nada Maclachlan, BA, ACSM CEP, Exercise Physiologist;Krista Frederico Hamman, RN BSN;Carroll Enterkin, RN, BSN   Supervising physician immediately available to respond to emergencies See telemetry face sheet for immediately available ER MD   Medication changes reported     No   Fall or balance concerns reported    No   Tobacco Cessation No Change   Warm-up and Cool-down Performed on first and last piece of equipment   Resistance Training Performed Yes   VAD Patient? No     Pain Assessment   Currently in Pain? No/denies   Multiple Pain Sites No         History  Smoking Status  . Former Smoker  . Packs/day: 0.50  . Years: 30.00  . Quit date: 10/30/2016  Smokeless Tobacco  . Former User    Comment: Remains Quit 12/14/2016    Goals Met:  Independence with exercise equipment Exercise tolerated well Personal goals reviewed No report of cardiac concerns or symptoms Strength training completed today  Goals Unmet:  Not Applicable  Comments:  [name] graduated today from cardiac rehab with 35 sessions completed.  Details of the patient's exercise prescription and what He needs to do in order to continue the prescription and progress were discussed with patient.  Patient was given a copy of prescription and goals.  Patient verbalized understanding.  [name] plans to continue to exercise by exercising at home and continuing going to his gym.    Dr. Emily Filbert is Medical Director for [place] [name].

## 2017-01-18 NOTE — Progress Notes (Signed)
Discharge Summary  Patient Details  Name: William Bates MRN: [id number] Date of Birth: 1966/01/02 Referring Provider:     Cardiac Rehab from 11/19/2016 in [facility]  Referring Provider  Isaias Cowman MD       Number of Visits: 37  Reason for Discharge:  Patient reached a stable level of exercise. Patient independent in their exercise.  Smoking History:  History  Smoking Status  . Former Smoker  . Packs/day: 0.50  . Years: 30.00  . Quit date: 10/30/2016  Smokeless Tobacco  . Former User    Comment: Remains Quit 12/14/2016    Diagnosis:  ST elevation myocardial infarction (STEMI), unspecified artery ([place])  Status post coronary artery stent placement  ADL [facility]:   Initial Exercise Prescription:     Initial Exercise Prescription - 11/19/16 1400      Date of Initial Exercise RX and Referring Provider   Date 11/19/16   Referring Provider Isaias Cowman MD     Treadmill   MPH 3   Grade 1   Minutes 15   METs 3.71     Elliptical   Level 1   Speed 4   Minutes 15     REL-XR   Level 3   Speed 50   Minutes 15   METs 3     Prescription Details   Frequency (times per week) 3   Duration Progress to 45 minutes of aerobic exercise without signs/symptoms of physical distress     Intensity   THRR 40-80% of Max Heartrate 96-145   Ratings of Perceived Exertion 11-13   Perceived Dyspnea 0-4     Progression   Progression Continue to progress workloads to maintain intensity without signs/symptoms of physical distress.     Resistance Training   Training Prescription Yes   Weight 4 lbs   Reps 10-15      Discharge Exercise Prescription (Final Exercise Prescription Changes):     Exercise Prescription Changes - 01/11/17 0900      Response to Exercise   Blood Pressure (Admit) 110/74   Blood Pressure (Exercise) 112/78   Blood Pressure (Exit) 110/78   Heart Rate (Admit) 58 bpm   Heart Rate (Exercise) 115 bpm   Heart Rate  (Exit) 71 bpm   Rating of Perceived Exertion (Exercise) 13   Symptoms none   Duration Continue with 45 min of aerobic exercise without signs/symptoms of physical distress.   Intensity THRR unchanged     Progression   Progression Continue to progress workloads to maintain intensity without signs/symptoms of physical distress.   Average METs 8.9     Resistance Training   Training Prescription Yes   Weight 10 lbs   Reps 10-15     Interval Training   Interval Training Yes   Equipment Treadmill   Comments 2 min off 30 sec on     Treadmill   MPH 3.7   Grade 10   Minutes 15   METs 8.9     Elliptical   Level 5.4   Speed 5.8   Minutes 30     Home Exercise Plan   Plans to continue exercise at [facility] (comment)  Full Time Fitness   Frequency Add 3 additional days to program exercise sessions.   Initial Home Exercises Provided 11/23/16      Functional Capacity:     6 Minute Walk    Row Name 11/19/16 1424 01/09/17 0835       6 Minute Walk  Phase Initial Discharge    Distance 1545 feet 1740 feet    Distance % Change  - 12.6 %  195 ft    Walk Time 6 minutes 6 minutes    # of Rest Breaks 0 0    MPH 2.93 3.3    METS 4.09 4.23    RPE 7 7    VO2 Peak 14.31 14.8    Symptoms No No    Resting HR 45 bpm 58 bpm    Resting BP 112/64 110/74    Max Ex. HR 77 bpm 127 bpm    Max Ex. BP 164/66 112/78    2 Minute Post BP 128/70  -       Psychological, QOL, Others - Outcomes: PHQ 2/9: Depression screen [facility] 2/9 01/18/2017 11/19/2016  Decreased Interest 0 1  Down, Depressed, Hopeless 0 1  PHQ - 2 Score 0 2  Altered sleeping 1 2  Tired, decreased energy 1 1  Change in appetite 1 0  Feeling bad or failure about yourself  - 0  Trouble concentrating 0 0  Moving slowly or fidgety/restless 0 0  Suicidal thoughts 0 0  PHQ-9 Score 3 5  Difficult doing work/chores Not difficult at all Not difficult at all    Quality of Life:     Quality of Life - 01/18/17 0818       Quality of Life Scores   Health/Function Post 26.4 %   Socioeconomic Post 25.86 %   Psych/Spiritual Post 23.07 %   Family Post 27 %   GLOBAL Post 25.65 %      Personal Goals: Goals established at orientation with interventions provided to work toward goal.     Personal Goals and Risk Factors at Admission - 11/19/16 1248      Core Components/Risk Factors/Patient Goals on Admission    Weight Management Yes   Intervention Weight Management: Develop a combined nutrition and exercise program designed to reach desired caloric intake, while maintaining appropriate intake of nutrient and fiber, sodium and fats, and appropriate energy expenditure required for the weight goal.;Weight Management: Provide education and appropriate resources to help participant work on and attain dietary goals.;Weight Management/Obesity: Establish reasonable short term and long term weight goals.   Admit Weight 250 lb (113.4 kg)   Goal Weight: Short Term 245 lb (111.1 kg)   Goal Weight: Long Term 230 lb (104.3 kg)   Expected Outcomes Short Term: Continue to assess and modify interventions until short term weight is achieved;Weight Loss: Understanding of general recommendations for a balanced deficit meal plan, which promotes 1-2 lb weight loss per week and includes a negative energy balance of [phone] kcal/d;Understanding recommendations for meals to include 15-35% energy as protein, 25-35% energy from fat, 35-60% energy from carbohydrates, less than 261m of dietary cholesterol, 20-35 gm of total fiber daily   Tobacco Cessation Yes   Number of packs per day Quit smoking 1/4 pack day times 30 years the day he had his heart attack on 10/30/2016   Intervention Offer self-teaching materials, assist with locating and accessing local/national Quit Smoking programs, and support quit date choice.   Expected Outcomes Long Term: Complete abstinence from all tobacco products for at least 12 months from quit date.;Short Term:  Will quit all tobacco product use, adhering to prevention of relapse plan.   Hypertension Yes   Intervention Provide education on lifestyle modifcations including regular physical activity/exercise, weight management, moderate sodium restriction and increased consumption of fresh fruit, vegetables, and low fat  dairy, alcohol moderation, and smoking cessation.;Monitor prescription use compliance.   Expected Outcomes Short Term: Continued assessment and intervention until BP is < 140/90mm HG in hypertensive participants. < 130/80mm HG in hypertensive participants with diabetes, heart failure or chronic kidney disease.;Long Term: Maintenance of blood pressure at goal levels.   Lipids Yes   Intervention Provide education and support for participant on nutrition & aerobic/resistive exercise along with prescribed medications to achieve LDL <70mg, HDL >40mg.   Expected Outcomes Short Term: Participant states understanding of desired cholesterol values and is compliant with medications prescribed. Participant is following exercise prescription and nutrition guidelines.;Long Term: Cholesterol controlled with medications as prescribed, with individualized exercise RX and with personalized nutrition plan. Value goals: LDL < 70mg, HDL > 40 mg.   Stress Yes   Intervention Offer individual and/or small group education and counseling on adjustment to heart disease, stress management and health-related lifestyle change. Teach and support self-help strategies.;Refer participants experiencing significant psychosocial distress to appropriate mental health specialists for further evaluation and treatment. When possible, include family members and significant others in education/counseling sessions.   Expected Outcomes Short Term: Participant demonstrates changes in health-related behavior, relaxation and other stress management skills, ability to obtain effective social support, and compliance with psychotropic medications if  prescribed.;Long Term: Emotional wellbeing is indicated by absence of clinically significant psychosocial distress or social isolation.       Personal Goals Discharge:     Goals and Risk Factor Review    Row Name 12/07/16 0841 12/28/16 0954           Core Components/Risk Factors/Patient Goals Review   Personal Goals Review Weight Management/Obesity;Tobacco Cessation;Hypertension;Lipids Weight Management/Obesity;Tobacco Cessation;Hypertension;Lipids      Review Steve's weight is down to 242 lbs!  He has already met his short term goal for weight loss!!  He has stayed away from the cigarrettes!!  He is feeling better and gaining confidence everyday.  His blood pressure was up today, but overall has been going down.  He has his lipids checked recently and they have greatly improved!!  He is hoping that when he goes in for his appointment in two weeks they may back off his lipitor.  He has his echo scheduled for 7/16! He really is looking forward to getting rid of the Life Vest!! Steve continues to lose weight!  No smoking!  Feeling much better!  He said that classes have really helped build up his confidence.  His blood pressures have been good. And he has not had any problems with his medications.  He is has been a holter montior for a few days after an episode on junctional rhythm.  He was disappointed to have to go back on a monitor as he was enjoying his freedom from the LifeVest!      Expected Outcomes Short: Go in for echo and find out more about his lipids.  Long: Continue to work on weight loss and staying smoke free. Short: Continue to work on weight loss.  Long: Continue to work on risk factor modification.         Nutrition & Weight - Outcomes:     Pre Biometrics - 11/19/16 1427      Pre Biometrics   Height 6' 0.9" (1.852 m)   Weight 250 lb 4.8 oz (113.5 kg)   Waist Circumference 41 inches   Hip Circumference 42 inches   Waist to Hip Ratio 0.98 %   BMI (Calculated) 33.2    Single Leg Stand 30 seconds           Post Biometrics - 01/09/17 0839       Post  Biometrics   Height 6' 0.9" (1.852 m)   Weight 242 lb (109.8 kg)   Waist Circumference 40 inches   Hip Circumference 42 inches   Waist to Hip Ratio 0.95 %   BMI (Calculated) 32.1   Single Leg Stand 30 seconds      Nutrition:     Nutrition Therapy & Goals - 12/04/16 0835      Nutrition Therapy   Diet Instructed on a meal plan based on 2000 calories and heart healthy dietary principles.   Drug/Food Interactions Statins/Certain Fruits   Protein (specify units) 8   Fiber 30 grams   Whole Grain Foods 3 servings   Saturated Fats 14 max. grams   Fruits and Vegetables 5 servings/day  8 is optimal   Sodium 2000 grams  1500 is ideal     Personal Nutrition Goals   Nutrition Goal Read labels for saturated and trans fat and sodium.   Personal Goal #2 Balance meals more with at least 2 servings of carbohydrate. Think of food guide plate which  balances carbohydrate, protein and non-starchy vegetables.   Personal Goal #3 Use phone apps such as caloreking.com to look up nutrition informaton especially for foods eaten "out".     Intervention Plan   Intervention Prescribe, educate and counsel regarding individualized specific dietary modifications aiming towards targeted core components such as weight, hypertension, lipid management, diabetes, heart failure and other comorbidities.;Nutrition handout(s) given to patient.   Expected Outcomes Short Term Goal: Understand basic principles of dietary content, such as calories, fat, sodium, cholesterol and nutrients.;Short Term Goal: A plan has been developed with personal nutrition goals set during dietitian appointment.;Long Term Goal: Adherence to prescribed nutrition plan.      Nutrition Discharge:     Nutrition Assessments - 01/18/17 0820      MEDFICTS Scores   Post Score 17      Education Questionnaire Score:     Knowledge Questionnaire Score -  01/18/17 0818      Knowledge Questionnaire Score   Post Score 26/28      Goals reviewed with patient; copy given to patient.

## 2017-05-30 ENCOUNTER — Encounter: Payer: Self-pay | Admitting: *Deleted

## 2017-05-30 NOTE — Progress Notes (Signed)
Cardiac Individual Treatment Plan  Patient Details  Name: William Bates MRN: [id number] Date of Birth: Dec 24, 1965 Referring Provider:     Cardiac Rehab from 11/19/2016 in [facility] Cardiac and Pulmonary Rehab  Referring Provider  William Cowman MD      Initial Encounter Date:    Cardiac Rehab from 11/19/2016 in [facility] Cardiac and Pulmonary Rehab  Date  11/19/16  Referring Provider  William Cowman MD      Visit Diagnosis: ST elevation myocardial infarction (STEMI), unspecified artery [facility]  Status post coronary artery stent placement  Patient's Home Medications on Admission:  Current Outpatient Medications:  .  acetaminophen (TYLENOL) 500 MG tablet, Take 500 mg by mouth every 6 (six) hours as needed., Disp: , Rfl:  .  aspirin 81 MG chewable tablet, Chew 1 tablet (81 mg total) by mouth daily., Disp: , Rfl:  .  atorvastatin (LIPITOR) 40 MG tablet, Take 1 tablet (40 mg total) by mouth daily at 6 PM., Disp: 30 tablet, Rfl: 0 .  lisinopril (PRINIVIL,ZESTRIL) 2.5 MG tablet, Take 1 tablet (2.5 mg total) by mouth daily., Disp: 30 tablet, Rfl: 0 .  metoprolol succinate (TOPROL-XL) 25 MG 24 hr tablet, Take by mouth., Disp: , Rfl:  .  naproxen sodium (ANAPROX) 220 MG tablet, Take 220 mg by mouth 2 (two) times daily with a meal., Disp: , Rfl:  .  ticagrelor (BRILINTA) 90 MG TABS tablet, Take 1 tablet (90 mg total) by mouth 2 (two) times daily., Disp: 60 tablet, Rfl: 0  Past Medical History: Past Medical History:  Diagnosis Date  . Alcohol abuse   . Pre-diabetes   . Tobacco use     Tobacco Use: Social History   Tobacco Use  Smoking Status Former Smoker  . Packs/day: 0.50  . Years: 30.00  . Pack years: 15.00  . Last attempt to quit: 10/30/2016  . Years since quitting: 0.5  Smokeless Tobacco Former User  Tobacco Comment   Remains Quit 12/14/2016    Labs: Recent Review Flowsheet Data    Labs for ITP Cardiac and Pulmonary Rehab Latest Ref Rng & Units 10/30/2016 10/31/2016  10/31/2016   Cholestrol 0 - 200 mg/dL 202(H) 160 -   LDLCALC 0 - 99 mg/dL 110(H) 103(H) -   HDL >40 mg/dL 37(L) 30(L) -   Trlycerides <150 mg/dL 276(H) 136 136   Hemoglobin A1c 4.8 - 5.6 % 6.0(H) - -       Exercise Target Goals:    Exercise Program Goal: Individual exercise prescription set with THRR, safety & activity barriers. Participant demonstrates ability to understand and report RPE using BORG scale, to self-measure pulse accurately, and to acknowledge the importance of the exercise prescription.  Exercise Prescription Goal: Starting with aerobic activity 30 plus minutes a day, 3 days per week for initial exercise prescription. Provide home exercise prescription and guidelines that participant acknowledges understanding prior to discharge.  Activity Barriers & Risk Stratification:   6 Minute Walk: 6 Minute Walk    Row Name 01/09/17 0835         6 Minute Walk   Phase  Discharge     Distance  1740 feet     Distance % Change  12.6 % 195 ft     Walk Time  6 minutes     # of Rest Breaks  0     MPH  3.3     METS  4.23     RPE  7     VO2 Peak  14.8  Symptoms  No     Resting HR  58 bpm     Resting BP  110/74     Max Ex. HR  127 bpm     Max Ex. BP  112/78        Oxygen Initial Assessment:   Oxygen Re-Evaluation:   Oxygen Discharge (Final Oxygen Re-Evaluation):   Initial Exercise Prescription:   Perform Capillary Blood Glucose checks as needed.  Exercise Prescription Changes: Exercise Prescription Changes    Row Name 12/11/16 1500 12/27/16 1100 01/11/17 0900         Response to Exercise   Blood Pressure (Admit)  -  134/64  110/74     Blood Pressure (Exercise)  130/80  142/72  112/78     Blood Pressure (Exit)  138/60  104/62  110/78     Heart Rate (Admit)  38 bpm sent note to Dr about bradycardia  65 bpm  58 bpm     Heart Rate (Exercise)  108 bpm  114 bpm  115 bpm     Heart Rate (Exit)  76 bpm  60 bpm  71 bpm     Rating of Perceived Exertion  (Exercise)  13  12  13      Symptoms  none  none  none     Comments  -  rhythm change during education  -     Duration  Continue with 45 min of aerobic exercise without signs/symptoms of physical distress.  Continue with 45 min of aerobic exercise without signs/symptoms of physical distress.  Continue with 45 min of aerobic exercise without signs/symptoms of physical distress.     Intensity  THRR unchanged  THRR unchanged  THRR unchanged       Progression   Progression  Continue to progress workloads to maintain intensity without signs/symptoms of physical distress.  Continue to progress workloads to maintain intensity without signs/symptoms of physical distress.  Continue to progress workloads to maintain intensity without signs/symptoms of physical distress.     Average METs  5.61  8.9  8.9       Resistance Training   Training Prescription  Yes  Yes  Yes     Weight  4 lb  10 lbs  10 lbs     Reps  10-15  10-15  10-15       Interval Training   Interval Training  No  Yes  Yes     Equipment  -  Treadmill  Treadmill     Comments  -  2 min off 30 sec on  2 min off 30 sec on       Treadmill   MPH  3.5  3.7  3.7     Grade  4  10  10      Minutes  15  15  15      METs  5.61  8.9  8.9       Elliptical   Level  -  5.4  5.4     Speed  -  5.8  5.8     Minutes  -  30  30       REL-XR   Level  15  -  -     Minutes  15  -  -       Home Exercise Plan   Plans to continue exercise at  -  [employer] (comment) [employer] (comment) [employer]     Frequency  -  Add 3 additional days to program exercise sessions.  Add 3 additional days to program exercise sessions.     Initial Home Exercises Provided  -  11/23/16  11/23/16        Exercise Comments: Exercise Comments    Row Name 01/18/17 9379           Exercise Comments  William Bates graduated today from cardiac rehab with 35 sessions completed.  Details of the patient's exercise prescription and what He  needs to do in order to continue the prescription and progress were discussed with patient.  Patient was given a copy of prescription and goals.  Patient verbalized understanding.  William Bates plans to continue to exercise by exercising at home and continuing going to his gym.          Exercise Goals and Review:   Exercise Goals Re-Evaluation : Exercise Goals Re-Evaluation    Row Name 12/07/16 0839 12/11/16 1555 12/27/16 1143 12/28/16 0934 01/09/17 0839     Exercise Goal Re-Evaluation   Exercise Goals Review  Increase Physical Activity;Increase Strenth and Stamina  Increase Physical Activity;Increase Strenth and Stamina  Increase Physical Activity;Increase Strenth and Stamina  Increase Physical Activity;Increase Strenth and Stamina  Increase Physical Activity;Increase Strenth and Stamina   Comments  William Bates is feeling better.  He has more strength and stamina and no longer has rounds of shortness of breath.  He is going to the gym 3 days a week and works on the treadmill.  He has also started to add some weights into his routine.  William Bates has increased incline on Tm and resistance on XR. Staff willc ontinue to monitor progression.  William Bates continues to do well in rehab.  He is now up to 5.8 speed on the ellipitical and has added in intervals on the treadmill.  We will continue to monitor his progression.  William Bates has been going to the gym at least three days a week.  He likes that he is able to push harder now at the gym, but still maintains working out hardest while he is here with [place].  He is enjoying the intervals and has even started to add running into some of them.  William Bates tried intervals on the ellipitcal today and really felt they worked him hard.  He is committed to completing the program because it is helping him build his confidence back up.  William Bates improved his post 6MWT by 12.6%!   Expected Outcomes  Short: Continue to exercise at home and add different pieces in.  Long: Gain confidence in exercise.   Short - attend class on a regular basis Long - Improve overall functional fitness level  Short: Continue to work on intervals in rehab.  Long: Continue to exercise independently at gym.  Short: Continue to work on intervals.  Long: Continue to exercise independently  -   Row Name 01/11/17 0922             Exercise Goal Re-Evaluation   Exercise Goals Review  Increase Physical Activity;Increase Strenth and Stamina       Comments  William Bates has been doing well in rehab.  He is scheduled to graduate next week.  He plans to continue to exercise at [employer]       Expected Outcomes  Short: Graduate!  Long: Continue to exercise independently.          Discharge Exercise Prescription (Final Exercise Prescription Changes): Exercise Prescription Changes - 01/11/17 0900      Response to Exercise  Blood Pressure (Admit)  110/74    Blood Pressure (Exercise)  112/78    Blood Pressure (Exit)  110/78    Heart Rate (Admit)  58 bpm    Heart Rate (Exercise)  115 bpm    Heart Rate (Exit)  71 bpm    Rating of Perceived Exertion (Exercise)  13    Symptoms  none    Duration  Continue with 45 min of aerobic exercise without signs/symptoms of physical distress.    Intensity  THRR unchanged      Progression   Progression  Continue to progress workloads to maintain intensity without signs/symptoms of physical distress.    Average METs  8.9      Resistance Training   Training Prescription  Yes    Weight  10 lbs    Reps  10-15      Interval Training   Interval Training  Yes    Equipment  Treadmill    Comments  2 min off 30 sec on      Treadmill   MPH  3.7    Grade  10    Minutes  15    METs  8.9      Elliptical   Level  5.4    Speed  5.8    Minutes  30      Home Exercise Plan   Plans to continue exercise at  [facility] (comment) [employer]    Frequency  Add 3 additional days to program exercise sessions.    Initial Home Exercises Provided  11/23/16       Nutrition:   Target Goals: Understanding of nutrition guidelines, daily intake of sodium <1566m, cholesterol <2055m calories 30% from fat and 7% or less from saturated fats, daily to have 5 or more servings of fruits and vegetables.  Biometrics:  PoBarbie Haggis 01/09/17 0839       Post  Biometrics   Height  6' 0.9" (1.852 m)    Weight  242 lb (109.8 kg)    Waist Circumference  40 inches    Hip Circumference  42 inches    Waist to Hip Ratio  0.95 %    BMI (Calculated)  32.1    Single Leg Stand  30 seconds       Nutrition Therapy Plan and Nutrition Goals: Nutrition Therapy & Goals - 12/04/16 0835      Nutrition Therapy   Diet  Instructed on a meal plan based on 2000 calories and heart healthy dietary principles.    Drug/Food Interactions  Statins/Certain Fruits    Protein (specify units)  8    Fiber  30 grams    Whole Grain Foods  3 servings    Saturated Fats  14 max. grams    Fruits and Vegetables  5 servings/day 8 is optimal    Sodium  2000 grams 1500 is ideal      Personal Nutrition Goals   Nutrition Goal  Read labels for saturated and trans fat and sodium.    Personal Goal #2  Balance meals more with at least 2 servings of carbohydrate. Think of food guide plate which  balances carbohydrate, protein and non-starchy vegetables.    Personal Goal #3  Use phone apps such as caloreking.com to look up nutrition informaton especially for foods eaten "out".      Intervention Plan   Intervention  Prescribe, educate and counsel regarding individualized specific dietary modifications aiming towards targeted core components such as weight, hypertension,  lipid management, diabetes, heart failure and other comorbidities.;Nutrition handout(s) given to patient.    Expected Outcomes  Short Term Goal: Understand basic principles of dietary content, such as calories, fat, sodium, cholesterol and nutrients.;Short Term Goal: A plan has been developed with personal nutrition goals set during dietitian  appointment.;Long Term Goal: Adherence to prescribed nutrition plan.       Nutrition Discharge: Rate Your Plate Scores: Nutrition Assessments - 01/18/17 0820      MEDFICTS Scores   Post Score  17       Nutrition Goals Re-Evaluation: Nutrition Goals Re-Evaluation    [place] Name 12/07/16 0836 12/28/16 0958           Goals   Current Weight  242 lb (109.8 kg)  -      Nutrition Goal  Read labels for saturated and trans fat and sodium.  Balance meals  Read labels for saturated and trans fat and sodium.  Balance meals      Comment  William Bates enjoyed his appointment with the dietician.  He has already started to make changes to his diet.  He no longer adds any salt to his food.  He is trying to eat a more balanced diet.  He does not want to use the app and just wants to continue to make better choices.  William Bates continues to stick with his diet plan.  He has slipped up some by eating more dark chocolate and drinking less water.  He hopes to get back to it.  He continues to make mostly good choices!      Expected Outcome  Short: Continue to make changes in diet.  Long: Eat a heart healhty diet.  Short: Drink more water.  Long: Continue heart healthy diet.         Nutrition Goals Discharge (Final Nutrition Goals Re-Evaluation): Nutrition Goals Re-Evaluation - 12/28/16 2010      Goals   Nutrition Goal  Read labels for saturated and trans fat and sodium.  Balance meals    Comment  William Bates continues to stick with his diet plan.  He has slipped up some by eating more dark chocolate and drinking less water.  He hopes to get back to it.  He continues to make mostly good choices!    Expected Outcome  Short: Drink more water.  Long: Continue heart healthy diet.       Psychosocial: Target Goals: Acknowledge presence or absence of significant depression and/or stress, maximize coping skills, provide positive support system. Participant is able to verbalize types and ability to use techniques and skills needed  for reducing stress and depression.   Initial Review & Psychosocial Screening:   Quality of Life Scores:  Quality of Life - 05/30/17 1244      Quality of Life Scores   Health/Function Pre  19.07 %    Health/Function Post  26.4 %    Health/Function % Change  38.44 %    Socioeconomic Pre  21.07 %    Socioeconomic Post  25.86 %    Socioeconomic % Change   22.73 %    Psych/Spiritual Pre  20.21 %    Psych/Spiritual Post  23.07 %    Psych/Spiritual % Change  14.15 %    Family Pre  23 %    Family Post  27 %    Family % Change  17.39 %    GLOBAL Pre  20.21 %    GLOBAL Post  25.65 %    GLOBAL % Change  26.92 %       PHQ-9: Recent Review Flowsheet Data    Depression screen [facility] 2/9 01/18/2017 11/19/2016   Decreased Interest 0 1   Down, Depressed, Hopeless 0 1   PHQ - 2 Score 0 2   Altered sleeping 1 2   Tired, decreased energy 1 1   Change in appetite 1 0   Feeling bad or failure about yourself  - 0   Trouble concentrating 0 0   Moving slowly or fidgety/restless 0 0   Suicidal thoughts 0 0   PHQ-9 Score 3 5   Difficult doing work/chores Not difficult at all Not difficult at all     Interpretation of Total Score  Total Score Depression Severity:  1-4 = Minimal depression, 5-9 = Mild depression, 10-14 = Moderate depression, 15-19 = Moderately severe depression, 20-27 = Severe depression   Psychosocial Evaluation and Intervention: Psychosocial Evaluation - 12/24/16 0944      Psychosocial Evaluation & Interventions   Interventions  Encouraged to exercise with the program and follow exercise prescription;Relaxation education;Stress management education    Comments  Counselor met with Mr. Mcadam Radloff) today for initial psychosocial evaluation.  He is a 51 year old who had a heart attack and (2) stents on 5/29.  Burdell has a strong support system with a significant other of 10 years and a brother and sister who live locally.  Kalel was pre-diabetic prior to the heart attack and  had also recently had knee surgery.  He has had a chronic sleep problem sleeping 3 hours prior to the surgery but it has improved to maybe 5 hours.  Some of this is related to his dog waking him up in the middle of the night.  Counselor brainstormed with [place] ways to remedy this.  His appetite is good and his mood has improved since he began exercising.  He admits to a history of depression and anxiety subsequent to insomnia years ago.  He reports this has improved since the heart attack and he is coping better. with consistent exercise. Jhonathan quit smoking the day he had his heart attack and has been able to lose almost 20 pounds which is amazing.  Counselor commended pt on his progress and his determination to improve his health.  He has some stress related mostly to his significant other's stress with her job and her seasonal depression.  He is coping well with this currently and counselor encouraged ways to approach this with her. Plato has goals to continue to lose weight and lower his cholesterol, etc while in this class.  He will continue to exercise by running or walking his dog and continue to eat healthier as well.       Expected Outcomes  Hever will benefit from consistent exercise to achieve his stated goals.  He also has benefitted from meeting with the dietician to discuss heart health diet to help with his cholesterol numbers decreasing.  The psychoeducational components of this program will be helpful for Yiannis to learn ways to cope and deal with stress and depressive symptoms in his life and that of his significant other.     Continue Psychosocial Services   Follow up required by staff       Psychosocial Re-Evaluation: Psychosocial Re-Evaluation    [place] Name 12/07/16 [phone] 12/28/16 1000           Psychosocial Re-Evaluation   Current issues with  Current Sleep Concerns;Current Stress Concerns  Current Sleep Concerns;Current Stress Concerns  Comments  William Bates is doing better  mentally.  He no longer sits around thinking about his heart event and his future.  Now he is able to get up and go and not dwell on everything that has happened.  He is feeling better and more positive, he does not want to go on anit-depressants and exercise really seems to be helping.  He has a good support system in his girlfriend, she even came to class and his dietician appointment with him.  He has not been sleeping well as the dog has been waking him up at 2 am.   William Bates is doing well mentally.  Best place he has been since his heart event.  He is feeling good and making big strides forward.  He continues to build his confidence.  He has not been sleeping as well as girlfriend has been traveling more and the dog continues to wake him up.  His lack of sleep then alters his mood some, making him more likely to get easily frustrated.  He did get quite flustered on Wednesday about his ryhthm issues and event monitor, which really bothered him, but he is trying to get better about it.      Expected Outcomes  Short: Continue to exercise daily for endorphins.  Long: Continue to have a positive outlook.  Short: Continue to exercise for stress relief.  Long: Try to sleep better.       Interventions  Encouraged to attend Cardiac Rehabilitation for the exercise;Stress management education  Encouraged to attend Cardiac Rehabilitation for the exercise      Continue Psychosocial Services   Follow up required by staff  Follow up required by staff        Initial Review   Source of Stress Concerns  -  Chronic Illness         Psychosocial Discharge (Final Psychosocial Re-Evaluation): Psychosocial Re-Evaluation - 12/28/16 1000      Psychosocial Re-Evaluation   Current issues with  Current Sleep Concerns;Current Stress Concerns    Comments  William Bates is doing well mentally.  Best place he has been since his heart event.  He is feeling good and making big strides forward.  He continues to build his confidence.  He has  not been sleeping as well as girlfriend has been traveling more and the dog continues to wake him up.  His lack of sleep then alters his mood some, making him more likely to get easily frustrated.  He did get quite flustered on Wednesday about his ryhthm issues and event monitor, which really bothered him, but he is trying to get better about it.    Expected Outcomes  Short: Continue to exercise for stress relief.  Long: Try to sleep better.     Interventions  Encouraged to attend Cardiac Rehabilitation for the exercise    Continue Psychosocial Services   Follow up required by staff      Initial Review   Source of Stress Concerns  Chronic Illness       Vocational Rehabilitation: Provide vocational rehab assistance to qualifying candidates.   Vocational Rehab Evaluation & Intervention:   Education: Education Goals: Education classes will be provided on a variety of topics geared toward better understanding of heart health and risk factor modification. Participant will state understanding/return demonstration of topics presented as noted by education test scores.  Learning Barriers/Preferences:   Education Topics: General Nutrition Guidelines/Fats and Fiber: -Group instruction provided by verbal, written material, models and posters to present the  general guidelines for heart healthy nutrition. Gives an explanation and review of dietary fats and fiber.   Cardiac Rehab from 01/14/2017 in [facility] Cardiac and Pulmonary Rehab  Date  11/26/16  Educator  CR  Instruction Review Code (retired)  2- meets goals/outcomes      Controlling Sodium/Reading Food Labels: -Group verbal and written material supporting the discussion of sodium use in heart healthy nutrition. Review and explanation with models, verbal and written materials for utilization of the food label.   Cardiac Rehab from 01/14/2017 in [facility] Cardiac and Pulmonary Rehab  Date  12/03/16  Educator  CR  Instruction Review Code (retired)   2- meets goals/outcomes      Exercise Physiology & Risk Factors: - Group verbal and written instruction with models to review the exercise physiology of the cardiovascular system and associated critical values. Details cardiovascular disease risk factors and the goals associated with each risk factor.   Cardiac Rehab from 01/14/2017 in [facility] Cardiac and Pulmonary Rehab  Date  12/10/16  Educator  [facility]  Instruction Review Code (retired)  2- meets goals/outcomes      Aerobic Exercise & Resistance Training: - Gives group verbal and written discussion on the health impact of inactivity. On the components of aerobic and resistive training programs and the benefits of this training and how to safely progress through these programs.   Cardiac Rehab from 01/14/2017 in [facility] Cardiac and Pulmonary Rehab  Date  12/12/16  Educator  SB  Instruction Review Code (retired)  2- Statistician, Balance, General Exercise Guidelines: - Provides group verbal and written instruction on the benefits of flexibility and balance training programs. Provides general exercise guidelines with specific guidelines to those with heart or lung disease. Demonstration and skill practice provided.   Stress Management: - Provides group verbal and written instruction about the health risks of elevated stress, cause of high stress, and healthy ways to reduce stress.   Cardiac Rehab from 01/14/2017 in [facility] Cardiac and Pulmonary Rehab  Date  12/26/16  Educator  [facility]  Instruction Review Code (retired)  2- meets goals/outcomes      Depression: - Provides group verbal and written instruction on the correlation between heart/lung disease and depressed mood, treatment options, and the stigmas associated with seeking treatment.   Cardiac Rehab from 01/14/2017 in [facility] Cardiac and Pulmonary Rehab  Date  11/28/16  Educator  [facility]  Instruction Review Code (retired)  2- meets Designer, fashion/clothing & Physiology  of the Heart: - Group verbal and written instruction and models provide basic cardiac anatomy and physiology, with the coronary electrical and arterial systems. Review of: AMI, Angina, Valve disease, Heart Failure, Cardiac Arrhythmia, Pacemakers, and the ICD.   Cardiac Rehab from 01/14/2017 in [facility] Cardiac and Pulmonary Rehab  Date  12/24/16  Educator  CE  Instruction Review Code (retired)  2- meets goals/outcomes      Cardiac Procedures: - Group verbal and written instruction to review commonly prescribed medications for heart disease. Reviews the medication, class of the drug, and side effects. Includes the steps to properly store meds and maintain the prescription regimen. (beta blockers and nitrates)   Cardiac Rehab from 01/14/2017 in [facility] Cardiac and Pulmonary Rehab  Date  12/31/16  Educator  CE  Instruction Review Code (retired)  2- meets goals/outcomes      Cardiac Medications I: - Group verbal and written instruction to review commonly prescribed medications for heart disease. Reviews the  medication, class of the drug, and side effects. Includes the steps to properly store meds and maintain the prescription regimen.   Cardiac Rehab from 01/14/2017 in [facility] Cardiac and Pulmonary Rehab  Date  01/07/17 [01/07/17 Part I 8/8//18 Part 2]  Educator  CE  Instruction Review Code (retired)  2- meets goals/outcomes      Cardiac Medications II: -Group verbal and written instruction to review commonly prescribed medications for heart disease. Reviews the medication, class of the drug, and side effects. (all other drug classes)    Go Sex-Intimacy & Heart Disease, Get SMART - Goal Setting: - Group verbal and written instruction through game format to discuss heart disease and the return to sexual intimacy. Provides group verbal and written material to discuss and apply goal setting through the application of the S.M.A.R.T. Method.   Cardiac Rehab from 01/14/2017 in [facility] Cardiac and Pulmonary  Rehab  Date  12/31/16  Educator  CE  Instruction Review Code (retired)  2- meets goals/outcomes      Other Matters of the Heart: - Provides group verbal, written materials and models to describe Heart Failure, Angina, Valve Disease, Peripheral Artery Disease, and Diabetes in the realm of heart disease. Includes description of the disease process and treatment options available to the cardiac patient.   Cardiac Rehab from 01/14/2017 in [facility] Cardiac and Pulmonary Rehab  Date  12/24/16  Educator  CE  Instruction Review Code (retired)  2- meets goals/outcomes      Exercise & Equipment Safety: - Individual verbal instruction and demonstration of equipment use and safety with use of the equipment.   Cardiac Rehab from 01/14/2017 in [facility] Cardiac and Pulmonary Rehab  Date  11/19/16  Educator  C. Underwood  Instruction Review Code (retired)  1- partially meets, needs review/practice      Infection Prevention: - Provides verbal and written material to individual with discussion of infection control including proper hand washing and proper equipment cleaning during exercise session.   Cardiac Rehab from 01/14/2017 in [facility] Cardiac and Pulmonary Rehab  Date  11/19/16  Educator  C. Buncombe  Instruction Review Code (retired)  2- meets [employer] Prevention: - Provides verbal and written material to individual with discussion of falls prevention and safety.   Cardiac Rehab from 01/14/2017 in [facility] Cardiac and Pulmonary Rehab  Date  11/19/16  Educator  C. ENterkinRN  Instruction Review Code (retired)  2- meets goals/outcomes      Diabetes: - Individual verbal and written instruction to review signs/symptoms of diabetes, desired ranges of glucose level fasting, after meals and with exercise. Acknowledge that pre and post exercise glucose checks will be done for 3 sessions at entry of program.   Other: -Provides group and verbal instruction on various topics (see  comments)    Knowledge Questionnaire Score: Knowledge Questionnaire Score - 01/18/17 0818      Knowledge Questionnaire Score   Post Score  26/28       Core Components/Risk Factors/Patient Goals at Admission:   Core Components/Risk Factors/Patient Goals Review:  Goals and Risk Factor Review    Row Name 12/07/16 0841 12/28/16 0954           Core Components/Risk Factors/Patient Goals Review   Personal Goals Review  Weight Management/Obesity;Tobacco Cessation;Hypertension;Lipids  Weight Management/Obesity;Tobacco Cessation;Hypertension;Lipids      Review  Steve's weight is down to 242 lbs!  He has already met his short term goal for weight loss!!  He has stayed away from  the cigarrettes!!  He is feeling better and gaining confidence everyday.  His blood pressure was up today, but overall has been going down.  He has his lipids checked recently and they have greatly improved!!  He is hoping that when he goes in for his appointment in two weeks they may back off his lipitor.  He has his echo scheduled for 7/16! He really is looking forward to getting rid of the Life Vest!!  William Bates continues to lose weight!  No smoking!  Feeling much better!  He said that classes have really helped build up his confidence.  His blood pressures have been good. And he has not had any problems with his medications.  He is has been a holter montior for a few days after an episode on junctional rhythm.  He was disappointed to have to go back on a monitor as he was enjoying his freedom from the LifeVest!      Expected Outcomes  Short: Go in for echo and find out more about his lipids.  Long: Continue to work on weight loss and staying smoke free.  Short: Continue to work on weight loss.  Long: Continue to work on risk factor modification.         Core Components/Risk Factors/Patient Goals at Discharge (Final Review):  Goals and Risk Factor Review - 12/28/16 0954      Core Components/Risk Factors/Patient Goals  Review   Personal Goals Review  Weight Management/Obesity;Tobacco Cessation;Hypertension;Lipids    Review  William Bates continues to lose weight!  No smoking!  Feeling much better!  He said that classes have really helped build up his confidence.  His blood pressures have been good. And he has not had any problems with his medications.  He is has been a holter montior for a few days after an episode on junctional rhythm.  He was disappointed to have to go back on a monitor as he was enjoying his freedom from the LifeVest!    Expected Outcomes  Short: Continue to work on weight loss.  Long: Continue to work on risk factor modification.       ITP Comments: ITP Comments    Row Name 12/11/16 1447 12/19/16 0634 12/27/16 1144 01/16/17 0609     ITP Comments  Approximately 30 minutes after check in during education on 12/10/2016 patient's HR decreased to 38 - Sinus Bradycardia.  Patient asymptomatic, alert and oriented.  Upon arrival to Cardiac Rehab HR was in the 70s - NSR.  Noted during the previous sessions patient's HR was 57 and 55 respectively.  Patient on Metoprolol (Toprol XL) 25 mg daily.  A note was faxed to Dr. Saralyn Pilar to make him aware.  Awaiting response from MD.      30 day review. Continue with ITP unless directed changes per Medical Director review     William Bates had a rhythm change during education on Wednesday.  Strips were sent to office for review.  William Bates was then asked to come in to be put on a holter monitor to review rhythm.  30 day review. Continue with ITP unless directed changes per Medical Director review        Comments:

## 2017-10-17 ENCOUNTER — Emergency Department
Admission: EM | Admit: 2017-10-17 | Discharge: 2017-10-17 | Disposition: A | Discharge status: Home / Self Care | Payer: 59 | Attending: Emergency Medicine | Admitting: Emergency Medicine

## 2017-10-17 ENCOUNTER — Other Ambulatory Visit: Payer: Self-pay

## 2017-10-17 ENCOUNTER — Encounter: Payer: Self-pay | Admitting: Emergency Medicine

## 2017-10-17 ENCOUNTER — Emergency Department: Payer: 59

## 2017-10-17 DIAGNOSIS — I252 Old myocardial infarction: Secondary | ICD-10-CM | POA: Diagnosis not present

## 2017-10-17 DIAGNOSIS — R079 Chest pain, unspecified: Secondary | ICD-10-CM | POA: Diagnosis present

## 2017-10-17 DIAGNOSIS — Z87891 Personal history of nicotine dependence: Secondary | ICD-10-CM | POA: Diagnosis not present

## 2017-10-17 DIAGNOSIS — Z79899 Other long term (current) drug therapy: Secondary | ICD-10-CM | POA: Diagnosis not present

## 2017-10-17 DIAGNOSIS — Z7982 Long term (current) use of aspirin: Secondary | ICD-10-CM | POA: Diagnosis not present

## 2017-10-17 DIAGNOSIS — R0789 Other chest pain: Secondary | ICD-10-CM

## 2017-10-17 DIAGNOSIS — I2510 Atherosclerotic heart disease of native coronary artery without angina pectoris: Secondary | ICD-10-CM | POA: Insufficient documentation

## 2017-10-17 HISTORY — DX: Atherosclerotic heart disease of native coronary artery without angina pectoris: I25.10

## 2017-10-17 LAB — CBC
HEMATOCRIT: 44.6 % (ref 40.0–52.0)
HEMOGLOBIN: 15.8 g/dL (ref 13.0–18.0)
MCH: 30.5 pg (ref 26.0–34.0)
MCHC: 35.4 g/dL (ref 32.0–36.0)
MCV: 86.1 fL (ref 80.0–100.0)
Platelets: 193 10*3/uL (ref 150–440)
RBC: 5.17 MIL/uL (ref 4.40–5.90)
RDW: 13.1 % (ref 11.5–14.5)
WBC: 6.7 10*3/uL (ref 3.8–10.6)

## 2017-10-17 LAB — BASIC METABOLIC PANEL
Anion gap: 6 (ref 5–15)
BUN: 19 mg/dL (ref 6–20)
CO2: 21 mmol/L — AB (ref 22–32)
Calcium: 9 mg/dL (ref 8.9–10.3)
Chloride: 107 mmol/L (ref 101–111)
Creatinine, Ser: 0.98 mg/dL (ref 0.61–1.24)
GFR calc Af Amer: >60 mL/min (ref >60)
GFR calc non Af Amer: >60 mL/min (ref >60)
GLUCOSE: 128 mg/dL — AB (ref 65–99)
POTASSIUM: 4.1 mmol/L (ref 3.5–5.1)
Sodium: 134 mmol/L — ABNORMAL LOW (ref 135–145)

## 2017-10-17 LAB — TROPONIN I
Troponin I: <0.03 ng/mL (ref <0.03)
Troponin I: <0.03 ng/mL (ref <0.03)

## 2017-10-17 NOTE — ED Notes (Signed)
Patient unhooked from monitor when this RN entered room. Refused full set of discharge vitals. Patient verbalized understanding of discharge instructions and follow-up care. Ambulatory to lobby with steady gait and NAD noted.

## 2017-10-17 NOTE — ED Provider Notes (Signed)
[facility] Emergency Department Provider Note   ____________________________________________    I have reviewed the triage vital signs and the nursing notes.   HISTORY  Chief Complaint Chest Pain     HPI William Bates is a 52 y.o. male who presents with complaints of a brief episode of chest pain today at approximately 9 AM.  Patient reports he had a episode of burning substernally which resolved after 1 to 2 minutes.  He states he had a stent placed approximately 1 year ago in the LAD.  Denies fevers or chills or shortness of breath.  No nausea or vomiting.  No diaphoresis.  No shortness of breath.  No calf pain or swelling.  Did not take anything for this.   Past Medical History:  Diagnosis Date  . Alcohol abuse   . Coronary artery disease   . Pre-diabetes   . Tobacco use     Patient Active Problem List   Diagnosis Date Noted  . Chest pain   . Cardiac arrest, cause unspecified (HCC)   . STEMI (ST elevation myocardial infarction) (HCC) 10/30/2016  . Sinusitis 11/17/2012  . Tobacco use disorder 06/30/2011    Past Surgical History:  Procedure Laterality Date  . ANTERIOR CRUCIATE LIGAMENT REPAIR  [phone]   right  . APPENDECTOMY  1998  . CORONARY STENT INTERVENTION N/A 10/30/2016   Procedure: Coronary Stent Intervention;  Surgeon: Marcina Millard, MD;  Location: [facility] INVASIVE CV LAB;  Service: Cardiovascular;  Laterality: N/A;  . CORONARY STENT INTERVENTION N/A 10/31/2016   Procedure: Coronary Stent Intervention;  Surgeon: Marcina Millard, MD;  Location: [facility] INVASIVE CV LAB;  Service: Cardiovascular;  Laterality: N/A;  . LEFT HEART CATH AND CORONARY ANGIOGRAPHY N/A 10/30/2016   Procedure: Left Heart Cath and Coronary Angiography;  Surgeon: Marcina Millard, MD;  Location: [facility] INVASIVE CV LAB;  Service: Cardiovascular;  Laterality: N/A;  . LEFT HEART CATH AND CORONARY ANGIOGRAPHY N/A 10/31/2016   Procedure: Left Heart Cath and  Coronary Angiography;  Surgeon: Marcina Millard, MD;  Location: [facility] INVASIVE CV LAB;  Service: Cardiovascular;  Laterality: N/A;    Prior to Admission medications   Medication Sig Start Date End Date Taking? Authorizing Provider  aspirin 81 MG chewable tablet Chew 1 tablet (81 mg total) by mouth daily. 11/04/16  Yes Milagros Loll, MD  atorvastatin (LIPITOR) 40 MG tablet Take 1 tablet (40 mg total) by mouth daily at 6 PM. 11/03/16  Yes Sudini, Srikar, MD  lisinopril (PRINIVIL,ZESTRIL) 2.5 MG tablet Take 1 tablet (2.5 mg total) by mouth daily. 11/04/16  Yes Sudini, Wardell Heath, MD  metoprolol succinate (TOPROL-XL) 25 MG 24 hr tablet Take 25 mg by mouth daily.  11/09/16 11/09/17 Yes [provider]  ticagrelor (BRILINTA) 90 MG TABS tablet Take 1 tablet (90 mg total) by mouth 2 (two) times daily. 11/03/16  Yes Milagros Loll, MD  acetaminophen (TYLENOL) 500 MG tablet Take 500 mg by mouth every 6 (six) hours as needed.    [provider]     Allergies Patient has no known allergies.  Family History  Problem Relation Age of Onset  . Arthritis Father   . Diabetes Father   . Heart disease Father   . Hypertension Mother   . Prostate cancer Paternal Uncle   . Breast cancer Neg Hx   . Colon cancer Neg Hx     Social History Social History   Tobacco Use  . Smoking status: Former Smoker    Packs/day: 0.50  Years: 30.00    Pack years: 15.00    Last attempt to quit: 10/30/2016    Years since quitting: 0.9  . Smokeless tobacco: Former Neurosurgeon  . Tobacco comment: Remains Quit 12/14/2016  Substance Use Topics  . Alcohol use: No  . Drug use: No    Review of Systems  Constitutional: No fever/chills Eyes: No visual changes.  ENT: No sore throat. Cardiovascular: As above Respiratory: Denies shortness of breath. Gastrointestinal: No abdominal pain.  No nausea, no vomiting.   Genitourinary: Negative for dysuria. Musculoskeletal: Negative for back pain. Skin: Negative for  rash. Neurological: Negative for headaches    ____________________________________________   PHYSICAL EXAM:  VITAL SIGNS: ED Triage Vitals  Enc Vitals Group     BP 10/17/17 1005 103/74     Pulse Rate 10/17/17 1005 (!) 50     Resp 10/17/17 1005 16     Temp 10/17/17 1005 97.7 F (36.5 C)     Temp Source 10/17/17 1005 Oral     SpO2 10/17/17 1005 98 %     Weight 10/17/17 1006 115.7 kg (255 lb)     Height 10/17/17 1006 1.854 m ( )     Head Circumference --      Peak Flow --      Pain Score 10/17/17 1005 0     Pain Loc --      Pain Edu? --      Excl. in GC? --     Constitutional: Alert and oriented. No acute distress. Pleasant and interactive Eyes: Conjunctivae are normal.  Nose: No congestion/rhinnorhea. Mouth/Throat: Mucous membranes are moist.   Neck:  Painless ROM Cardiovascular: Normal rate, regular rhythm. Grossly normal heart sounds.  Good peripheral circulation. Respiratory: Normal respiratory effort.  No retractions. Lungs CTAB. Gastrointestinal: Soft and nontender. No distention.  No CVA tenderness.  Musculoskeletal: No lower extremity tenderness nor edema.  Warm and well perfused Neurologic:  Normal speech and language. No gross focal neurologic deficits are appreciated.  Skin:  Skin is warm, dry and intact. No rash noted. Psychiatric: Mood and affect are normal. Speech and behavior are normal.  ____________________________________________   LABS (all labs ordered are listed, but only abnormal results are displayed)  Labs Reviewed  BASIC METABOLIC PANEL - Abnormal; Notable for the following components:      Result Value   Sodium 134 (*)    CO2 21 (*)    Glucose, Bld 128 (*)    All other components within normal limits  CBC  TROPONIN I  TROPONIN I   ____________________________________________  EKG  ED ECG REPORT I, Jene Every, the attending physician, personally viewed and interpreted this ECG.  Date: 10/17/2017  Rate: 50 Rhythm:  sinus  rhythm QRS Axis: Left axis deviation Intervals: normal ST/T Wave abnormalities: normal Narrative Interpretation: no evidence of acute ischemia  ____________________________________________  RADIOLOGY  Chest x-ray unremarkable ____________________________________________   PROCEDURES  Procedure(s) performed: No  Procedures   Critical Care performed: No ____________________________________________   INITIAL IMPRESSION / ASSESSMENT AND PLAN / ED COURSE  Pertinent labs & imaging results that were available during my care of the patient were reviewed by me and considered in my medical decision making (see chart for details).  Patient with a history of CAD including stent in the LAD presents with brief episode of chest discomfort which he describes as burning in nature and felt like heartburn.  Symptoms resolved after a couple of minutes.  Feels well now and is a symptomatic.  Initial  troponin is <0.03, EKG is overall unremarkable.  Will obtain second troponin and discuss with cardiology.  2nd troponin negative. D/w Cardiologist Dr. Juliann Pares who recommends d/c with close outpatient follow up.  Appointment made with Dr. Darrold Junker at 8:45 AM on Monday    ____________________________________________   FINAL CLINICAL IMPRESSION(S) / ED DIAGNOSES  Final diagnoses:  Atypical chest pain        Note:  This document was prepared using Dragon voice recognition software and may include unintentional dictation errors.    Jene Every, MD 10/17/17 [phone]

## 2017-10-17 NOTE — ED Triage Notes (Signed)
Pt states midsternal cp that began this am with greater "stabbing" pain to the middle of his back, consistent with symptoms leading up to MI last May. Appears in no distress at this time, has been under a lot of stress at work also.

## 2018-02-02 ENCOUNTER — Encounter: Payer: Self-pay | Admitting: Emergency Medicine

## 2018-02-02 ENCOUNTER — Emergency Department
Admission: EM | Admit: 2018-02-02 | Discharge: 2018-02-03 | Disposition: A | Discharge status: Home / Self Care | Payer: 59 | Attending: Emergency Medicine | Admitting: Emergency Medicine

## 2018-02-02 ENCOUNTER — Emergency Department: Payer: 59

## 2018-02-02 ENCOUNTER — Other Ambulatory Visit: Payer: Self-pay

## 2018-02-02 DIAGNOSIS — Z87891 Personal history of nicotine dependence: Secondary | ICD-10-CM | POA: Diagnosis not present

## 2018-02-02 DIAGNOSIS — R7303 Prediabetes: Secondary | ICD-10-CM | POA: Diagnosis not present

## 2018-02-02 DIAGNOSIS — Z79899 Other long term (current) drug therapy: Secondary | ICD-10-CM | POA: Insufficient documentation

## 2018-02-02 DIAGNOSIS — I2510 Atherosclerotic heart disease of native coronary artery without angina pectoris: Secondary | ICD-10-CM | POA: Diagnosis not present

## 2018-02-02 DIAGNOSIS — Z7982 Long term (current) use of aspirin: Secondary | ICD-10-CM | POA: Insufficient documentation

## 2018-02-02 DIAGNOSIS — R079 Chest pain, unspecified: Secondary | ICD-10-CM | POA: Insufficient documentation

## 2018-02-02 HISTORY — DX: Acute myocardial infarction, unspecified: I21.9

## 2018-02-02 LAB — BASIC METABOLIC PANEL
Anion gap: 5 (ref 5–15)
BUN: 16 mg/dL (ref 6–20)
CALCIUM: 9.2 mg/dL (ref 8.9–10.3)
CO2: 23 mmol/L (ref 22–32)
CREATININE: 0.98 mg/dL (ref 0.61–1.24)
Chloride: 109 mmol/L (ref 98–111)
GFR calc Af Amer: >60 mL/min (ref >60)
GLUCOSE: 108 mg/dL — AB (ref 70–99)
Potassium: 4.1 mmol/L (ref 3.5–5.1)
Sodium: 137 mmol/L (ref 135–145)

## 2018-02-02 LAB — CBC
HEMATOCRIT: 46.6 % (ref 40.0–52.0)
Hemoglobin: 16.5 g/dL (ref 13.0–18.0)
MCH: 29.8 pg (ref 26.0–34.0)
MCHC: 35.3 g/dL (ref 32.0–36.0)
MCV: 84.3 fL (ref 80.0–100.0)
PLATELETS: 216 10*3/uL (ref 150–440)
RBC: 5.53 MIL/uL (ref 4.40–5.90)
RDW: 13.6 % (ref 11.5–14.5)
WBC: 8.6 10*3/uL (ref 3.8–10.6)

## 2018-02-02 LAB — TROPONIN I
Troponin I: <0.03 ng/mL (ref <0.03)
Troponin I: <0.03 ng/mL (ref <0.03)

## 2018-02-02 NOTE — ED Notes (Signed)
Report given to Rebecca RN.

## 2018-02-02 NOTE — ED Triage Notes (Signed)
Patient with complaint of intermittent central chest pain with neck and back pain that started earlier today and has become worse over the past hour. Patient with history of MI.

## 2018-02-02 NOTE — ED Provider Notes (Signed)
-----------------------------------------   11:12 PM on 02/02/2018 -----------------------------------------   Assuming care from Dr. Pershing Proud.  In short, William Bates is a 52 y.o. male with a chief complaint of chest pain.  Refer to the original H&P for additional details.  The current plan of care is to follow up second troponin scheduled for midnight.  Anticipate d/c home with outpatient cardiology follow up.    ----------------------------------------- 12:29 AM on 02/03/2018 -----------------------------------------  Patient without pain at this time. Second troponin was negative.  Went over plan with patient, he already has a follow up appointment scheduled for after the Labor Day weekend.  I gave my usual and customary return precautions.    Loleta Rose, MD 02/03/18 0030

## 2018-02-02 NOTE — ED Provider Notes (Signed)
[facility] Emergency Department Provider Note ____________________________________________   First MD Initiated Contact with Patient 02/02/18 2151     (approximate)  I have reviewed the triage vital signs and the nursing notes.   HISTORY  Chief Complaint Chest Pain  HPI William Bates is a 52 y.o. male with a history of coronary artery disease status post stents who is on aspirin and Brilinta was presented to the emergency department with chest pain.  He says that he has had intermittent chest pain throughout the day today the last for about 20 minutes.  He says that the pain is over the center of the sternum and appears on the surface of his chest.  He says that it radiates up to his neck as well as throat and has intermittent pain through to his back.  Denies any shortness of breath, nausea vomiting or diaphoresis.  Says that the pain is positional, especially when he leans his head to the left.  He says it feels like a tightness in his neck and thinks that his pain may be from muscle aching secondary to a very soft mattress that he has at home which he dislikes.  The patient says that he is compliant with his Brilinta.   Past Medical History:  Diagnosis Date  . Alcohol abuse   . Coronary artery disease   . MI (myocardial infarction) (HCC)   . Pre-diabetes   . Tobacco use     Patient Active Problem List   Diagnosis Date Noted  . Chest pain   . Cardiac arrest, cause unspecified (HCC)   . STEMI (ST elevation myocardial infarction) (HCC) 10/30/2016  . Sinusitis 11/17/2012  . Tobacco use disorder 06/30/2011    Past Surgical History:  Procedure Laterality Date  . ANTERIOR CRUCIATE LIGAMENT REPAIR  [phone]   right  . APPENDECTOMY  1998  . CORONARY STENT INTERVENTION N/A 10/30/2016   Procedure: Coronary Stent Intervention;  Surgeon: Marcina Millard, MD;  Location: [facility] INVASIVE CV LAB;  Service: Cardiovascular;  Laterality: N/A;  . CORONARY STENT  INTERVENTION N/A 10/31/2016   Procedure: Coronary Stent Intervention;  Surgeon: Marcina Millard, MD;  Location: [facility] INVASIVE CV LAB;  Service: Cardiovascular;  Laterality: N/A;  . KNEE SURGERY    . LEFT HEART CATH AND CORONARY ANGIOGRAPHY N/A 10/30/2016   Procedure: Left Heart Cath and Coronary Angiography;  Surgeon: Marcina Millard, MD;  Location: [facility] INVASIVE CV LAB;  Service: Cardiovascular;  Laterality: N/A;  . LEFT HEART CATH AND CORONARY ANGIOGRAPHY N/A 10/31/2016   Procedure: Left Heart Cath and Coronary Angiography;  Surgeon: Marcina Millard, MD;  Location: [facility] INVASIVE CV LAB;  Service: Cardiovascular;  Laterality: N/A;    Prior to Admission medications   Medication Sig Start Date End Date Taking? Authorizing Provider  acetaminophen (TYLENOL) 500 MG tablet Take 500 mg by mouth every 6 (six) hours as needed.    [provider]  aspirin 81 MG chewable tablet Chew 1 tablet (81 mg total) by mouth daily. 11/04/16   Milagros Loll, MD  atorvastatin (LIPITOR) 40 MG tablet Take 1 tablet (40 mg total) by mouth daily at 6 PM. 11/03/16   Milagros Loll, MD  lisinopril (PRINIVIL,ZESTRIL) 2.5 MG tablet Take 1 tablet (2.5 mg total) by mouth daily. 11/04/16   Milagros Loll, MD  metoprolol succinate (TOPROL-XL) 25 MG 24 hr tablet Take 25 mg by mouth daily.  11/09/16 11/09/17  [provider]  ticagrelor (BRILINTA) 90 MG TABS tablet Take 1 tablet (90 mg  total) by mouth 2 (two) times daily. 11/03/16   Milagros Loll, MD    Allergies Patient has no known allergies.  Family History  Problem Relation Age of Onset  . Arthritis Father   . Diabetes Father   . Heart disease Father   . Hypertension Mother   . Prostate cancer Paternal Uncle   . Breast cancer Neg Hx   . Colon cancer Neg Hx     Social History Social History   Tobacco Use  . Smoking status: Former Smoker    Packs/day: 0.50    Years: 30.00    Pack years: 15.00    Last attempt to quit: 10/30/2016    Years  since quitting: 1.2  . Smokeless tobacco: Never Used  . Tobacco comment: Remains Quit 12/14/2016  Substance Use Topics  . Alcohol use: Yes    Comment: occ  . Drug use: No    Review of Systems  Constitutional: No fever/chills Eyes: No visual changes. ENT: No sore throat. Cardiovascular: As above Respiratory: Denies shortness of breath. Gastrointestinal: No abdominal pain.  No nausea, no vomiting.  No diarrhea.  No constipation. Genitourinary: Negative for dysuria. Musculoskeletal: As above Skin: Negative for rash. Neurological: Negative for headaches, focal weakness or numbness.   ____________________________________________   PHYSICAL EXAM:  VITAL SIGNS: ED Triage Vitals  Enc Vitals Group     BP 02/02/18 2124 95/69     Pulse Rate 02/02/18 2124 67     Resp 02/02/18 2124 18     Temp --      Temp src --      SpO2 02/02/18 2124 94 %     Weight 02/02/18 2121 260 lb (117.9 kg)     Height 02/02/18 2121 6\' 1"  (1.854 m)     Head Circumference --      Peak Flow --      Pain Score 02/02/18 2121 6     Pain Loc --      Pain Edu? --      Excl. in GC? --     Constitutional: Alert and oriented. Well appearing and in no acute distress. Eyes: Conjunctivae are normal.  Head: Atraumatic. Nose: No congestion/rhinnorhea. Mouth/Throat: Mucous membranes are moist.  Neck: No stridor.  Mild tenderness palpation of the bilateral trapezius muscles. Cardiovascular: Normal rate, regular rhythm. Grossly normal heart sounds.  Minimal tenderness to palpation over the anterior chest wall.  No crepitus or deformity. Respiratory: Normal respiratory effort.  No retractions. Lungs CTAB. Gastrointestinal: Soft and nontender. No distention. No CVA tenderness. Musculoskeletal: No lower extremity tenderness nor edema.  No joint effusions.  No tenderness palpation of the midthoracic back with the patient says that he was having pain earlier. Neurologic:  Normal speech and language. No gross focal  neurologic deficits are appreciated. Skin:  Skin is warm, dry and intact. No rash noted. Psychiatric: Mood and affect are normal. Speech and behavior are normal.  ____________________________________________   LABS (all labs ordered are listed, but only abnormal results are displayed)  Labs Reviewed  BASIC METABOLIC PANEL - Abnormal; Notable for the following components:      Result Value   Glucose, Bld 108 (*)    All other components within normal limits  CBC  TROPONIN I  TROPONIN I   ____________________________________________  EKG  ED ECG REPORT I, Arelia Longest, the attending physician, personally viewed and interpreted this ECG.   Date: 02/02/2018  EKG Time: 2118  Rate: 52  Rhythm: normal sinus rhythm  Axis: Left axis deviation  Intervals:none  ST&T Change: T wave inversion/biphasic T wave in V3 which is new from previous.  Otherwise there is no significant change from previous.  No ST elevation or depression.  ____________________________________________  RADIOLOGY  No acute finding on the chest x-ray ____________________________________________   PROCEDURES  Procedure(s) performed:   Procedures  Critical Care performed:   ____________________________________________   INITIAL IMPRESSION / ASSESSMENT AND PLAN / ED COURSE  Pertinent labs & imaging results that were available during my care of the patient were reviewed by me and considered in my medical decision making (see chart for details).  Differential diagnosis includes, but is not limited to, ACS, aortic dissection, pulmonary embolism, cardiac tamponade, pneumothorax, pneumonia, pericarditis, myocarditis, GI-related causes including esophagitis/gastritis, and musculoskeletal chest wall pain.   As part of my medical decision making, I reviewed the following data within the electronic MEDICAL RECORD NUMBER Notes from prior ED visits.   ----------------------------------------- 10:37 PM on  02/02/2018 -----------------------------------------  Patient with visit this past spring for what appeared to be atypical chest pain.  He was then seen in the office and had a stress test which was reassuring.  I discussed the case tonight with Dr. Lady Gary.  We agreed on checking a second troponin and if this is negative and the patient still appears benign and is chest pain-free that he may follow-up in the office for further evaluation and possible treatment.  ----------------------------------------- 11:17 PM on 02/02/2018 -----------------------------------------  Patient pending troponin at this time.  Signed out to Dr. York Cerise. ____________________________________________   FINAL CLINICAL IMPRESSION(S) / ED DIAGNOSES  Chest pain.  NEW MEDICATIONS STARTED DURING THIS VISIT:  New Prescriptions   No medications on file     Note:  This document was prepared using Dragon voice recognition software and may include unintentional dictation errors.     Myrna Blazer, MD 02/02/18 [phone]

## 2018-04-08 ENCOUNTER — Telehealth: Payer: Self-pay | Admitting: *Deleted

## 2018-04-08 NOTE — Telephone Encounter (Signed)
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  He would like to now return to [employer].  Is he able to start [employer] [employer] at this time?  Thanks, Fabio Pierce, MA, RCEP, CCRP 04/07/2018 3:04 PM

## 2018-05-12 ENCOUNTER — Encounter: Payer: Self-pay | Admitting: *Deleted

## 2018-05-13 ENCOUNTER — Other Ambulatory Visit: Payer: Self-pay

## 2018-05-13 ENCOUNTER — Encounter: Payer: Self-pay | Admitting: Student

## 2018-05-13 ENCOUNTER — Ambulatory Visit: Payer: 59 | Admitting: Anesthesiology

## 2018-05-13 ENCOUNTER — Encounter
Admission: RE | Disposition: A | Discharge status: Home / Self Care | Payer: Self-pay | Source: Ambulatory Visit | Attending: Internal Medicine

## 2018-05-13 ENCOUNTER — Ambulatory Visit
Admission: RE | Admit: 2018-05-13 | Discharge: 2018-05-13 | Disposition: A | Discharge status: Home / Self Care | Payer: 59 | Source: Ambulatory Visit | Attending: Internal Medicine | Admitting: Internal Medicine

## 2018-05-13 DIAGNOSIS — Z7982 Long term (current) use of aspirin: Secondary | ICD-10-CM | POA: Diagnosis not present

## 2018-05-13 DIAGNOSIS — K635 Polyp of colon: Secondary | ICD-10-CM | POA: Diagnosis not present

## 2018-05-13 DIAGNOSIS — G4730 Sleep apnea, unspecified: Secondary | ICD-10-CM | POA: Diagnosis not present

## 2018-05-13 DIAGNOSIS — K640 First degree hemorrhoids: Secondary | ICD-10-CM | POA: Insufficient documentation

## 2018-05-13 DIAGNOSIS — Z87891 Personal history of nicotine dependence: Secondary | ICD-10-CM | POA: Insufficient documentation

## 2018-05-13 DIAGNOSIS — J449 Chronic obstructive pulmonary disease, unspecified: Secondary | ICD-10-CM | POA: Diagnosis not present

## 2018-05-13 DIAGNOSIS — I2510 Atherosclerotic heart disease of native coronary artery without angina pectoris: Secondary | ICD-10-CM | POA: Insufficient documentation

## 2018-05-13 DIAGNOSIS — Z7902 Long term (current) use of antithrombotics/antiplatelets: Secondary | ICD-10-CM | POA: Diagnosis not present

## 2018-05-13 DIAGNOSIS — Z955 Presence of coronary angioplasty implant and graft: Secondary | ICD-10-CM | POA: Insufficient documentation

## 2018-05-13 DIAGNOSIS — K5730 Diverticulosis of large intestine without perforation or abscess without bleeding: Secondary | ICD-10-CM | POA: Diagnosis not present

## 2018-05-13 DIAGNOSIS — I252 Old myocardial infarction: Secondary | ICD-10-CM | POA: Insufficient documentation

## 2018-05-13 DIAGNOSIS — Z79899 Other long term (current) drug therapy: Secondary | ICD-10-CM | POA: Insufficient documentation

## 2018-05-13 DIAGNOSIS — Z1211 Encounter for screening for malignant neoplasm of colon: Secondary | ICD-10-CM | POA: Insufficient documentation

## 2018-05-13 DIAGNOSIS — I10 Essential (primary) hypertension: Secondary | ICD-10-CM | POA: Diagnosis not present

## 2018-05-13 HISTORY — DX: Unspecified osteoarthritis, unspecified site: M19.90

## 2018-05-13 HISTORY — DX: Presence of coronary angioplasty implant and graft: Z95.5

## 2018-05-13 HISTORY — DX: Essential (primary) hypertension: I10

## 2018-05-13 HISTORY — PX: COLONOSCOPY WITH PROPOFOL: SHX5780

## 2018-05-13 SURGERY — COLONOSCOPY WITH PROPOFOL
Anesthesia: General

## 2018-05-13 MED ORDER — FENTANYL CITRATE (PF) 100 MCG/2ML IJ SOLN
INTRAMUSCULAR | Status: AC
  Filled 2018-05-13: qty 2

## 2018-05-13 MED ORDER — PROPOFOL 500 MG/50ML IV EMUL
INTRAVENOUS | Status: AC
  Filled 2018-05-13: qty 50

## 2018-05-13 MED ORDER — PROPOFOL 500 MG/50ML IV EMUL
INTRAVENOUS | Status: DC | PRN
  Administered 2018-05-13: 200 ug/kg/min via INTRAVENOUS

## 2018-05-13 MED ORDER — PROPOFOL 10 MG/ML IV BOLUS
INTRAVENOUS | Status: AC
  Filled 2018-05-13: qty 20

## 2018-05-13 MED ORDER — FENTANYL CITRATE (PF) 100 MCG/2ML IJ SOLN
INTRAMUSCULAR | Status: DC | PRN
  Administered 2018-05-13 (×2): 50 ug via INTRAVENOUS

## 2018-05-13 MED ORDER — SODIUM CHLORIDE 0.9 % IV SOLN
INTRAVENOUS | Status: DC
  Administered 2018-05-13: 08:00:00 via INTRAVENOUS

## 2018-05-13 MED ORDER — PROPOFOL 10 MG/ML IV BOLUS
INTRAVENOUS | Status: DC | PRN
  Administered 2018-05-13: 100 mg via INTRAVENOUS

## 2018-05-13 MED ORDER — EPHEDRINE SULFATE 50 MG/ML IJ SOLN
INTRAMUSCULAR | Status: DC | PRN
  Administered 2018-05-13: 10 mg via INTRAVENOUS

## 2018-05-13 MED ORDER — LIDOCAINE 2% (20 MG/ML) 5 ML SYRINGE
INTRAMUSCULAR | Status: DC | PRN
  Administered 2018-05-13: 30 mg via INTRAVENOUS

## 2018-05-13 NOTE — Anesthesia Preprocedure Evaluation (Addendum)
Anesthesia Evaluation  Patient identified by MRN, date of birth, ID band Patient awake    Reviewed: Allergy & Precautions, H&P , NPO status , Patient's Chart, lab work & pertinent test results  History of Anesthesia Complications Negative for: history of anesthetic complications  Airway Mallampati: III  TM Distance: <3 FB Neck ROM: full    Dental  (+) Chipped, Poor Dentition   Pulmonary sleep apnea , COPD, former smoker,           Cardiovascular Exercise Tolerance: Good hypertension, (-) angina+ CAD, + Past MI and + Cardiac Stents  (-) DOE      Neuro/Psych PSYCHIATRIC DISORDERS negative neurological ROS     GI/Hepatic negative GI ROS, Neg liver ROS, neg GERD  ,  Endo/Other  negative endocrine ROS  Renal/GU negative Renal ROS  negative genitourinary   Musculoskeletal  (+) Arthritis ,   Abdominal   Peds  Hematology negative hematology ROS (+)   Anesthesia Other Findings Past Medical History: No date: Alcohol abuse No date: Arthritis     Comment:  right knee grade 3 out of 4 No date: Coronary artery disease No date: Hypertension No date: MI (myocardial infarction) (HCC) No date: Pre-diabetes 10/2016: S/P coronary artery stent placement No date: Tobacco use  Past Surgical History: 1996-1997: ANTERIOR CRUCIATE LIGAMENT REPAIR     Comment:  right 1998: APPENDECTOMY No date: ARTHROSCOPIC REPAIR ACL 10/30/2016: CORONARY STENT INTERVENTION; N/A     Comment:  Procedure: Coronary Stent Intervention;  Surgeon:               Marcina Millard, MD;  Location: [facility] INVASIVE CV               LAB;  Service: Cardiovascular;  Laterality: N/A; 10/31/2016: CORONARY STENT INTERVENTION; N/A     Comment:  Procedure: Coronary Stent Intervention;  Surgeon:               Marcina Millard, MD;  Location: [facility] INVASIVE CV               LAB;  Service: Cardiovascular;  Laterality: N/A; No date: KNEE SURGERY 10/30/2016: LEFT  HEART CATH AND CORONARY ANGIOGRAPHY; N/A     Comment:  Procedure: Left Heart Cath and Coronary Angiography;                Surgeon: Marcina Millard, MD;  Location: [facility]               INVASIVE CV LAB;  Service: Cardiovascular;  Laterality:               N/A; 10/31/2016: LEFT HEART CATH AND CORONARY ANGIOGRAPHY; N/A     Comment:  Procedure: Left Heart Cath and Coronary Angiography;                Surgeon: Marcina Millard, MD;  Location: [facility]               INVASIVE CV LAB;  Service: Cardiovascular;  Laterality:               N/A;  BMI    Body Mass Index:  32.98 kg/m      Reproductive/Obstetrics negative OB ROS                             Anesthesia Physical Anesthesia Plan  ASA: III  Anesthesia Plan: General   Post-op Pain Management:    Induction: Intravenous  PONV Risk Score and Plan:  Propofol infusion and TIVA  Airway Management Planned: Natural Airway and Nasal Cannula  Additional Equipment:   Intra-op Plan:   Post-operative Plan:   Informed Consent: I have reviewed the patients History and Physical, chart, labs and discussed the procedure including the risks, benefits and alternatives for the proposed anesthesia with the patient or authorized representative who has indicated his/her understanding and acceptance.   Dental Advisory Given  Plan Discussed with: Anesthesiologist, CRNA and Surgeon  Anesthesia Plan Comments: (Patient consented for risks of anesthesia including but not limited to:  - adverse reactions to medications - risk of intubation if required - damage to teeth, lips or other oral mucosa - sore throat or hoarseness - Damage to heart, brain, lungs or loss of life  Patient voiced understanding.)        Anesthesia Quick Evaluation

## 2018-05-13 NOTE — Transfer of Care (Signed)
Immediate Anesthesia Transfer of Care Note  Patient: William Bates  Procedure(s) Performed: COLONOSCOPY WITH PROPOFOL (N/A )  Patient Location: PACU and Endoscopy Unit  Anesthesia Type:General  Level of Consciousness: sedated  Airway & Oxygen Therapy: Patient Spontanous Breathing and Patient connected to nasal cannula oxygen  Post-op Assessment: Report given to RN and Post -op Vital signs reviewed and stable  Post vital signs: Reviewed and stable  Last Vitals:  Vitals Value Taken Time  BP    Temp    Pulse    Resp    SpO2      Last Pain:  Vitals:   05/13/18 0758  TempSrc: Tympanic  PainSc: 0-No pain         Complications: No apparent anesthesia complications

## 2018-05-13 NOTE — Op Note (Signed)
[facility] Patient Name: William Bates Procedure Date: 05/13/2018 9:19 AM MRN: [id number] Account #: [id number] Date of Birth: [birth year] Admit Type: Outpatient Age: 52 Room: [facility] ENDO ROOM 2 Gender: Male Note Status: Finalized Procedure:            Colonoscopy Indications:          Screening for colorectal malignant neoplasm Providers:            Boykin Nearing. Norma Fredrickson MD, MD Referring MD:         Danella Penton, MD (Referring MD) Medicines:            Propofol per Anesthesia Complications:        No immediate complications. Procedure:            Pre-Anesthesia Assessment:                       - The risks and benefits of the procedure and the                        sedation options and risks were discussed with the                        patient. All questions were answered and informed                        consent was obtained.                       - Patient identification and proposed procedure were                        verified prior to the procedure by the nurse. The                        procedure was verified in the procedure room.                       - ASA Grade Assessment: III - A patient with severe                        systemic disease.                       - After reviewing the risks and benefits, the patient                        was deemed in satisfactory condition to undergo the                        procedure.                       After obtaining informed consent, the colonoscope was                        passed under direct vision. Throughout the procedure,                        the patient's blood pressure, pulse, and oxygen  saturations were monitored continuously. The                        Colonoscope was introduced through the anus and                        advanced to the the cecum, identified by appendiceal                        orifice and ileocecal valve. The colonoscopy was                  performed without difficulty. The patient tolerated the                        procedure well. The quality of the bowel preparation                        was good. The ileocecal valve, appendiceal orifice, and                        rectum were photographed. Findings:      The perianal and digital rectal examinations were normal. Pertinent       negatives include normal sphincter tone and no palpable rectal lesions.      A 7 mm polyp was found in the sigmoid colon. The polyp was       semi-pedunculated. The polyp was removed with a cold snare. Resection       and retrieval were complete.      A few small-mouthed diverticula were found in the sigmoid colon.      Non-bleeding internal hemorrhoids were found during retroflexion. The       hemorrhoids were Grade I (internal hemorrhoids that do not prolapse).      The exam was otherwise without abnormality. Impression:           - One 7 mm polyp in the sigmoid colon, removed with a                        cold snare. Resected and retrieved.                       - Diverticulosis in the sigmoid colon.                       - Non-bleeding internal hemorrhoids.                       - The examination was otherwise normal. Recommendation:       - Patient has a contact number available for                        emergencies. The signs and symptoms of potential                        delayed complications were discussed with the patient.                        Return to normal activities tomorrow. Written discharge                        instructions  were provided to the patient.                       - Resume previous diet.                       - Continue present medications.                       - Repeat colonoscopy is recommended for surveillance.                        The colonoscopy date will be determined after pathology                        results from today's exam become available for review.                       -  Resume Brilinta at prior dose today. Refer to                        managing physician for further adjustment of therapy.                       - The findings and recommendations were discussed with                        the patient and their spouse.                       - Return to GI office PRN. Procedure Code(s):    --- Professional ---                       [phone], Colonoscopy, flexible; with removal of tumor(s),                        polyp(s), or other lesion(s) by snare technique Diagnosis Code(s):    --- Professional ---                       K57.30, Diverticulosis of large intestine without                        perforation or abscess without bleeding                       K64.0, First degree hemorrhoids                       D12.5, Benign neoplasm of sigmoid colon                       Z12.11, Encounter for screening for malignant neoplasm                        of colon CPT copyright 2018 American Medical Association. All rights reserved. The codes documented in this report are preliminary and upon coder review may  be revised to meet current compliance requirements. Stanton Kidney MD, MD 05/13/2018 9:39:02 AM This report has been signed electronically. Number of Addenda: 0 Note Initiated On: 05/13/2018 9:19 AM Scope Withdrawal Time: 0 hours 4 minutes 37 seconds  Total  Procedure Duration: 0 hours 9 minutes 39 seconds       [facility]

## 2018-05-13 NOTE — H&P (Signed)
Outpatient short stay form Pre-procedure 05/13/2018 9:05 AM William Bates, M.D.  Primary Physician:  Reason for visit:  Colon cancer screening  History of present illness:  Patient presents for colonoscopy for colon cancer screening. The patient denies complaints of abdominal pain, significant change in bowel habits, or rectal bleeding. Patient takes Marden NobleBrilinta but has held for 5 days.     Current Facility-Administered Medications:  .  0.9 %  sodium chloride infusion, , Intravenous, Continuous, Judsonoledo, Boykin Nearingeodoro K, MD, Last Rate: 20 mL/hr at 05/13/18 [id number]  Medications Prior to Admission  Medication Sig Dispense Refill Last Dose  . aspirin 81 MG chewable tablet Chew 1 tablet (81 mg total) by mouth daily.   05/12/2018 at Unknown time  . IBUPROFEN PO Take by mouth as needed.     Marland Kitchen. lisinopril (PRINIVIL,ZESTRIL) 2.5 MG tablet Take 1 tablet (2.5 mg total) by mouth daily. 30 tablet 0 05/12/2018 at Unknown time  . metoprolol succinate (TOPROL-XL) 25 MG 24 hr tablet Take 25 mg by mouth daily.    05/12/2018 at Unknown time  . nitroGLYCERIN (NITROSTAT) 0.4 MG SL tablet Place 0.4 mg under the tongue every 5 (five) minutes as needed for chest pain.     . pravastatin (PRAVACHOL) 40 MG tablet Take 40 mg by mouth daily.   05/12/2018 at Unknown time  . ticagrelor (BRILINTA) 90 MG TABS tablet Take 1 tablet (90 mg total) by mouth 2 (two) times daily. 60 tablet 0 Past Week at Unknown time  . acetaminophen (TYLENOL) 500 MG tablet Take 500 mg by mouth every 6 (six) hours as needed.   Not Taking at Unknown time  . atorvastatin (LIPITOR) 40 MG tablet Take 1 tablet (40 mg total) by mouth daily at 6 PM. (Patient not taking: Reported on 05/13/2018) 30 tablet 0 Not Taking at Unknown time  . etodolac (LODINE) 400 MG tablet Take 400 mg by mouth 2 (two) times daily.   Not Taking at Unknown time  . NAPROXEN PO Take 220 mg by mouth as needed.   Not Taking at Unknown time     No Known Allergies   Past Medical History:   Diagnosis Date  . Alcohol abuse   . Arthritis    right knee grade 3 out of 4  . Coronary artery disease   . Hypertension   . MI (myocardial infarction) (HCC)   . Pre-diabetes   . S/P coronary artery stent placement 10/2016  . Tobacco use     Review of systems:  Otherwise negative.    Physical Exam  Gen: Alert, oriented. Appears stated age.  HEENT: [employer]/AT. PERRLA. Lungs: CTA, no wheezes. CV: RR nl S1, S2. Abd: soft, benign, no masses. BS+ Ext: No edema. Pulses 2+    Planned procedures: Proceed with colonoscopy. The patient understands the nature of the planned procedure, indications, risks, alternatives and potential complications including but not limited to bleeding, infection, perforation, damage to internal organs and possible oversedation/side effects from anesthesia. The patient agrees and gives consent to proceed.  Please refer to procedure notes for findings, recommendations and patient disposition/instructions.     Keirah Konitzer K. Norma Bates, M.D. Gastroenterology 05/13/2018  9:05 AM

## 2018-05-13 NOTE — Anesthesia Postprocedure Evaluation (Signed)
Anesthesia Post Note  Patient: Trudee GripSteven J Castille  Procedure(s) Performed: COLONOSCOPY WITH PROPOFOL (N/A )  Patient location during evaluation: Endoscopy Anesthesia Type: General Level of consciousness: awake and alert Pain management: pain level controlled Vital Signs Assessment: post-procedure vital signs reviewed and stable Respiratory status: spontaneous breathing, nonlabored ventilation, respiratory function stable and patient connected to nasal cannula oxygen Cardiovascular status: blood pressure returned to baseline and stable Postop Assessment: no apparent nausea or vomiting Anesthetic complications: no     Last Vitals:  Vitals:   05/13/18 0958 05/13/18 1008  BP: 110/74 121/81  Pulse: (!) 53 (!) 47  Resp: 13 15  Temp:    SpO2: 99% 100%    Last Pain:  Vitals:   05/13/18 1008  TempSrc:   PainSc: 0-No pain                 Cleda MccreedyJoseph K Piscitello

## 2018-05-13 NOTE — Anesthesia Post-op Follow-up Note (Signed)
Anesthesia QCDR form completed.        

## 2018-05-13 NOTE — Interval H&P Note (Signed)
History and Physical Interval Note:  05/13/2018 9:13 AM  William Bates  has presented today for surgery, with the diagnosis of SCREENING  The various methods of treatment have been discussed with the patient and family. After consideration of risks, benefits and other options for treatment, the patient has consented to  Procedure(s): COLONOSCOPY WITH PROPOFOL (N/A) as a surgical intervention .  The patient's history has been reviewed, patient examined, no change in status, stable for surgery.  I have reviewed the patient's chart and labs.  Questions were answered to the patient's satisfaction.     Red Budoledo, Mosheimeodoro

## 2018-05-14 ENCOUNTER — Encounter: Payer: Self-pay | Admitting: Internal Medicine

## 2018-05-14 LAB — SURGICAL PATHOLOGY

## 2018-07-04 IMAGING — DX DG CHEST 1V PORT
1 series · 1 of 1 positions shown · non-contrast
Comparison: None.

CLINICAL DATA: STEMI in progress with chest pain and dyspnea.
Concern for dissection.

EXAM:
PORTABLE CHEST 1 VIEW

[chest ap]
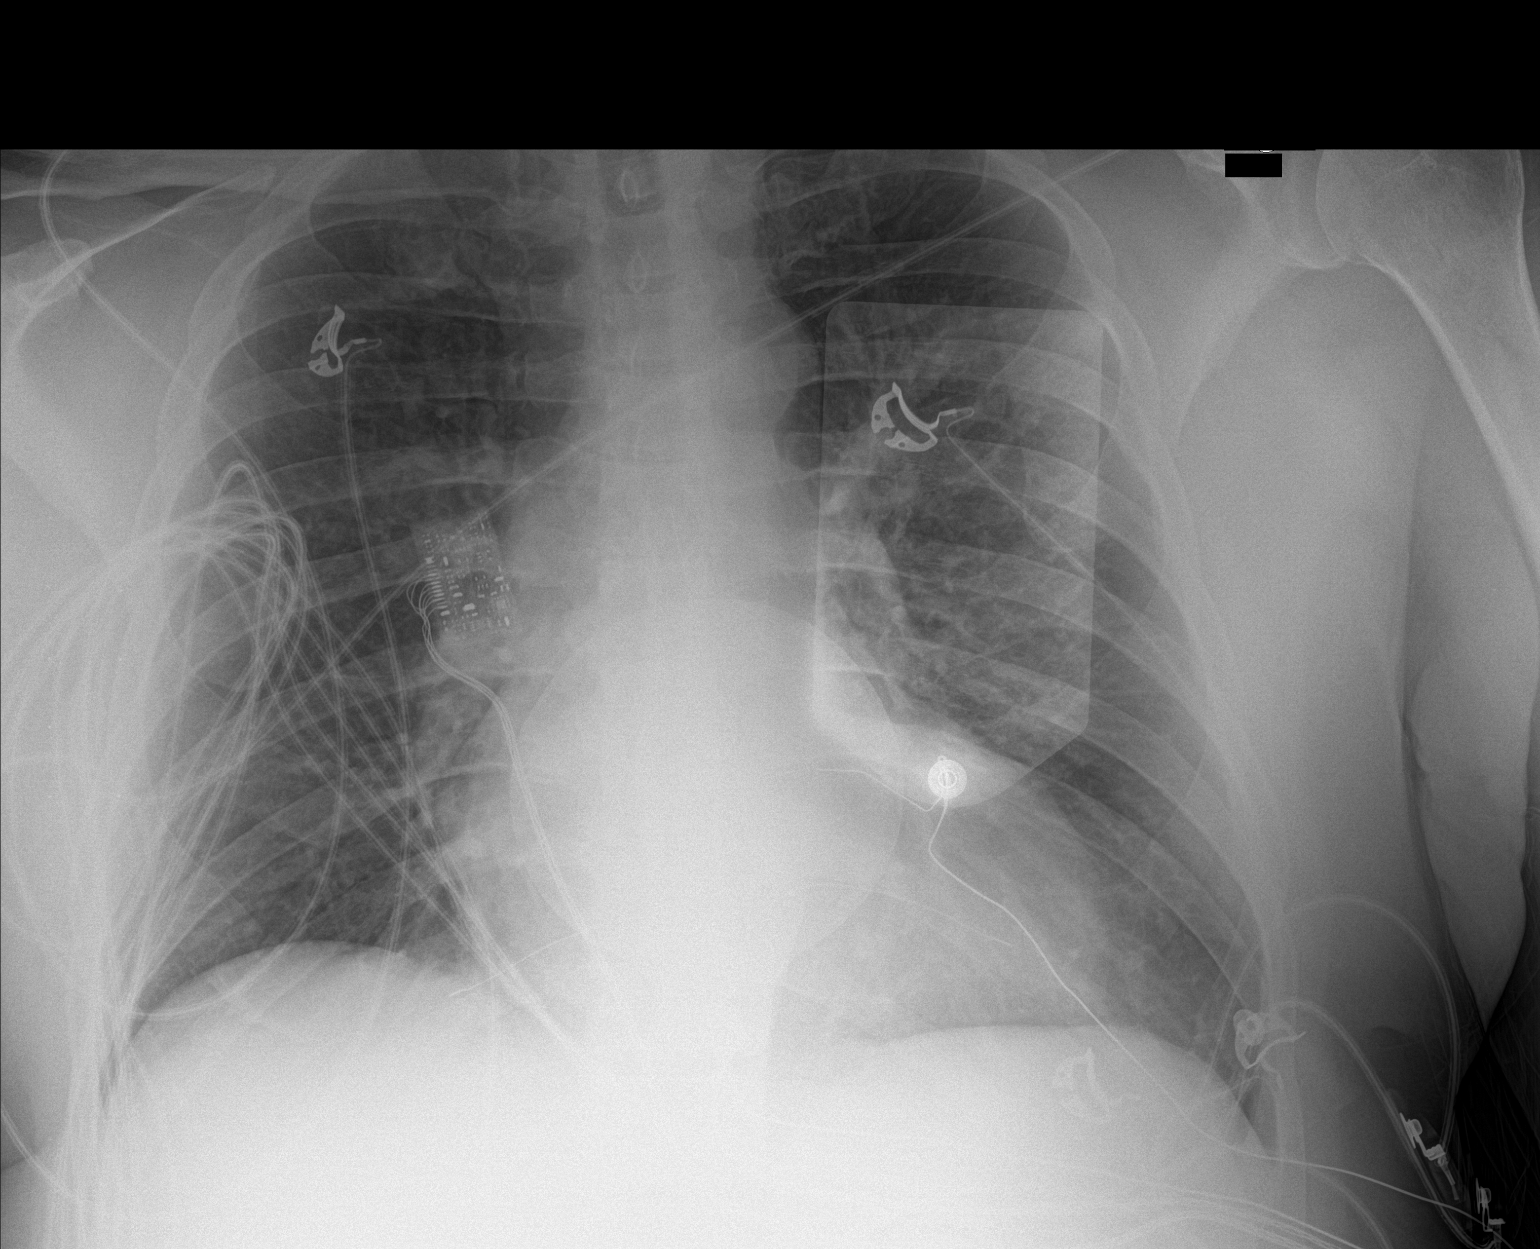

[1 of 1 positions shown; findings below may reference images not displayed]

FINDINGS: The heart size and mediastinal contours are within normal limits. No
mediastinal widening. External defibrillator paddles project over
the left hemithorax and cardiac silhouette. No pneumothorax or
pneumonic consolidation. Slight central vascular congestion. The
visualized skeletal structures are unremarkable.
IMPRESSION: No mediastinal widening. Slight vascular congestion. No acute
pulmonary disease.

## 2018-07-05 IMAGING — DX DG ABD PORTABLE 1V
1 series · 1 of 1 positions shown · non-contrast
Comparison: 10/18/2015.

CLINICAL DATA: Orogastric tube placement.

EXAM:
PORTABLE ABDOMEN - 1 VIEW

[abdomen kub]
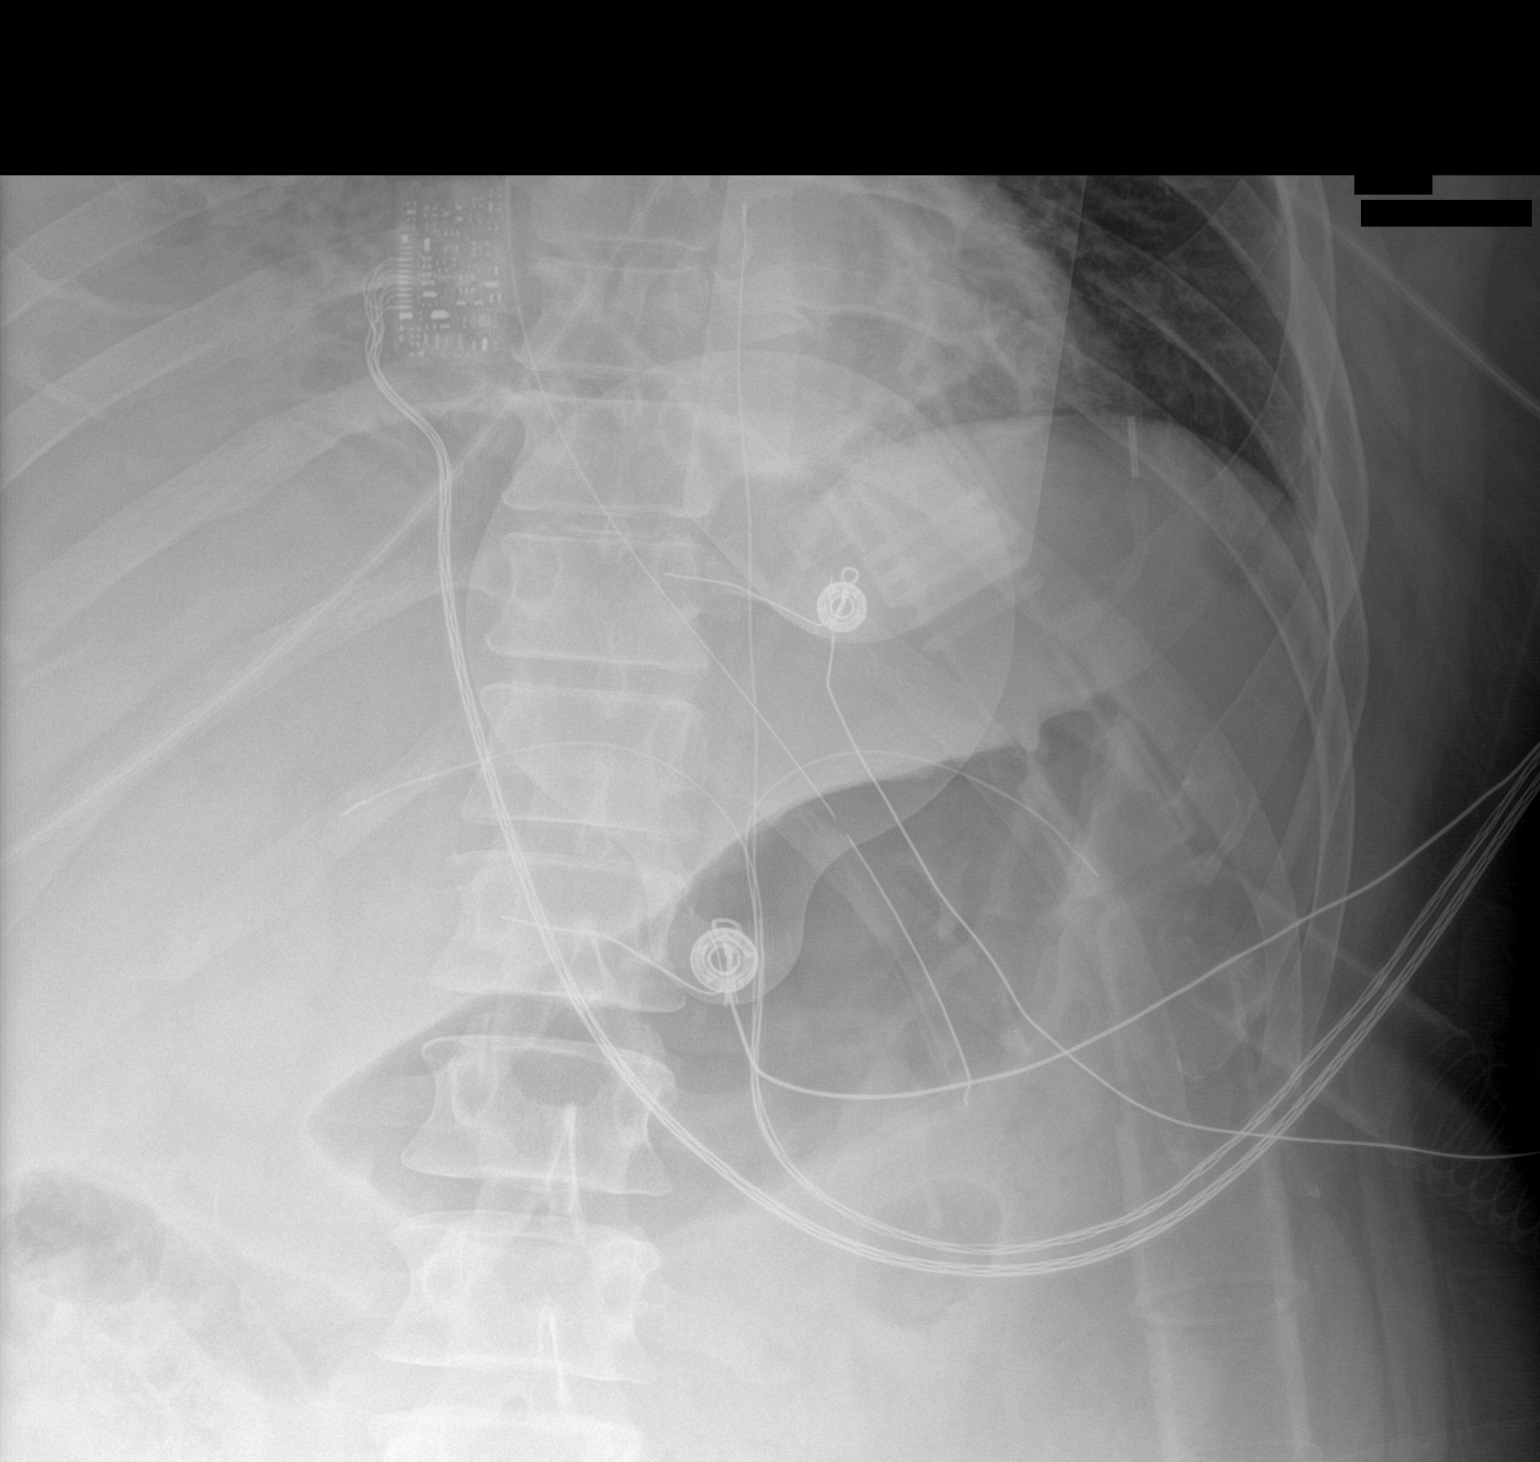

[1 of 1 positions shown; findings below may reference images not displayed]

FINDINGS: NG tube noted with tip projected over superior vena cava.
Degenerative changes lumbar spine. No acute or focal bony
abnormality identified. Bibasilar subsegmental atelectasis and/or
infiltrates.
IMPRESSION: 1. NG tube noted with its tip projected over superior vena cava.

2.  Right bibasilar subsegmental atelectasis and/or infiltrates.

## 2018-07-06 IMAGING — DX DG CHEST 1V
1 series · 1 of 1 positions shown · non-contrast
Comparison: 10/30/2016 .

CLINICAL DATA: Shortness of breath.

EXAM:
CHEST 1 VIEW

[chest ap]
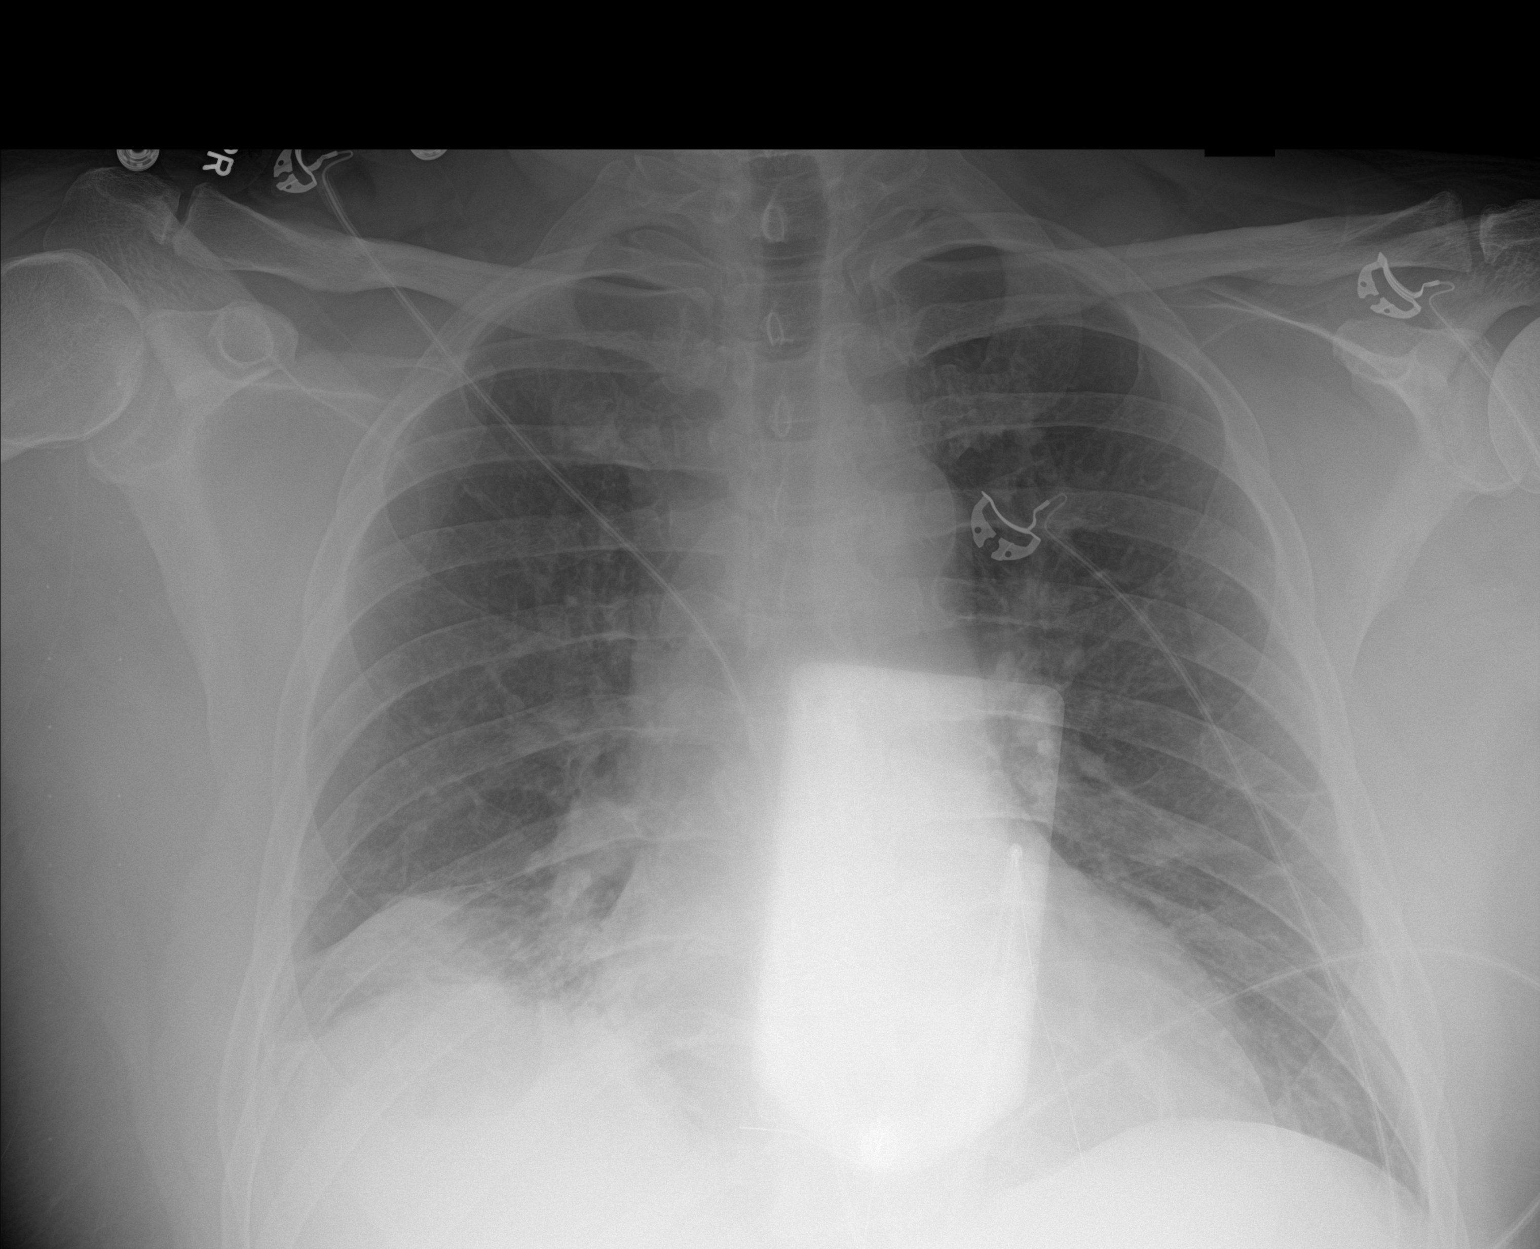

[1 of 1 positions shown; findings below may reference images not displayed]

FINDINGS: Mediastinum hilar structures are stable. Stable cardiomegaly.
Developing atelectasis and infiltrate right lung base. Small right
pleural effusion.
IMPRESSION: 1. Developing atelectasis and infiltrate right lung base. Small
right pleural effusion.

2. Stable cardiomegaly.

## 2019-06-21 IMAGING — CR DG CHEST 2V
1 series · 2 of 2 positions shown · non-contrast
Comparison: 11/01/2016

CLINICAL DATA: Chest pain, history coronary artery disease and
smoking

EXAM:
CHEST - 2 VIEW

[Series 1: dg chest 2 view · 0.14mm/px · 2 of 2 slices shown]
[im 1/2]
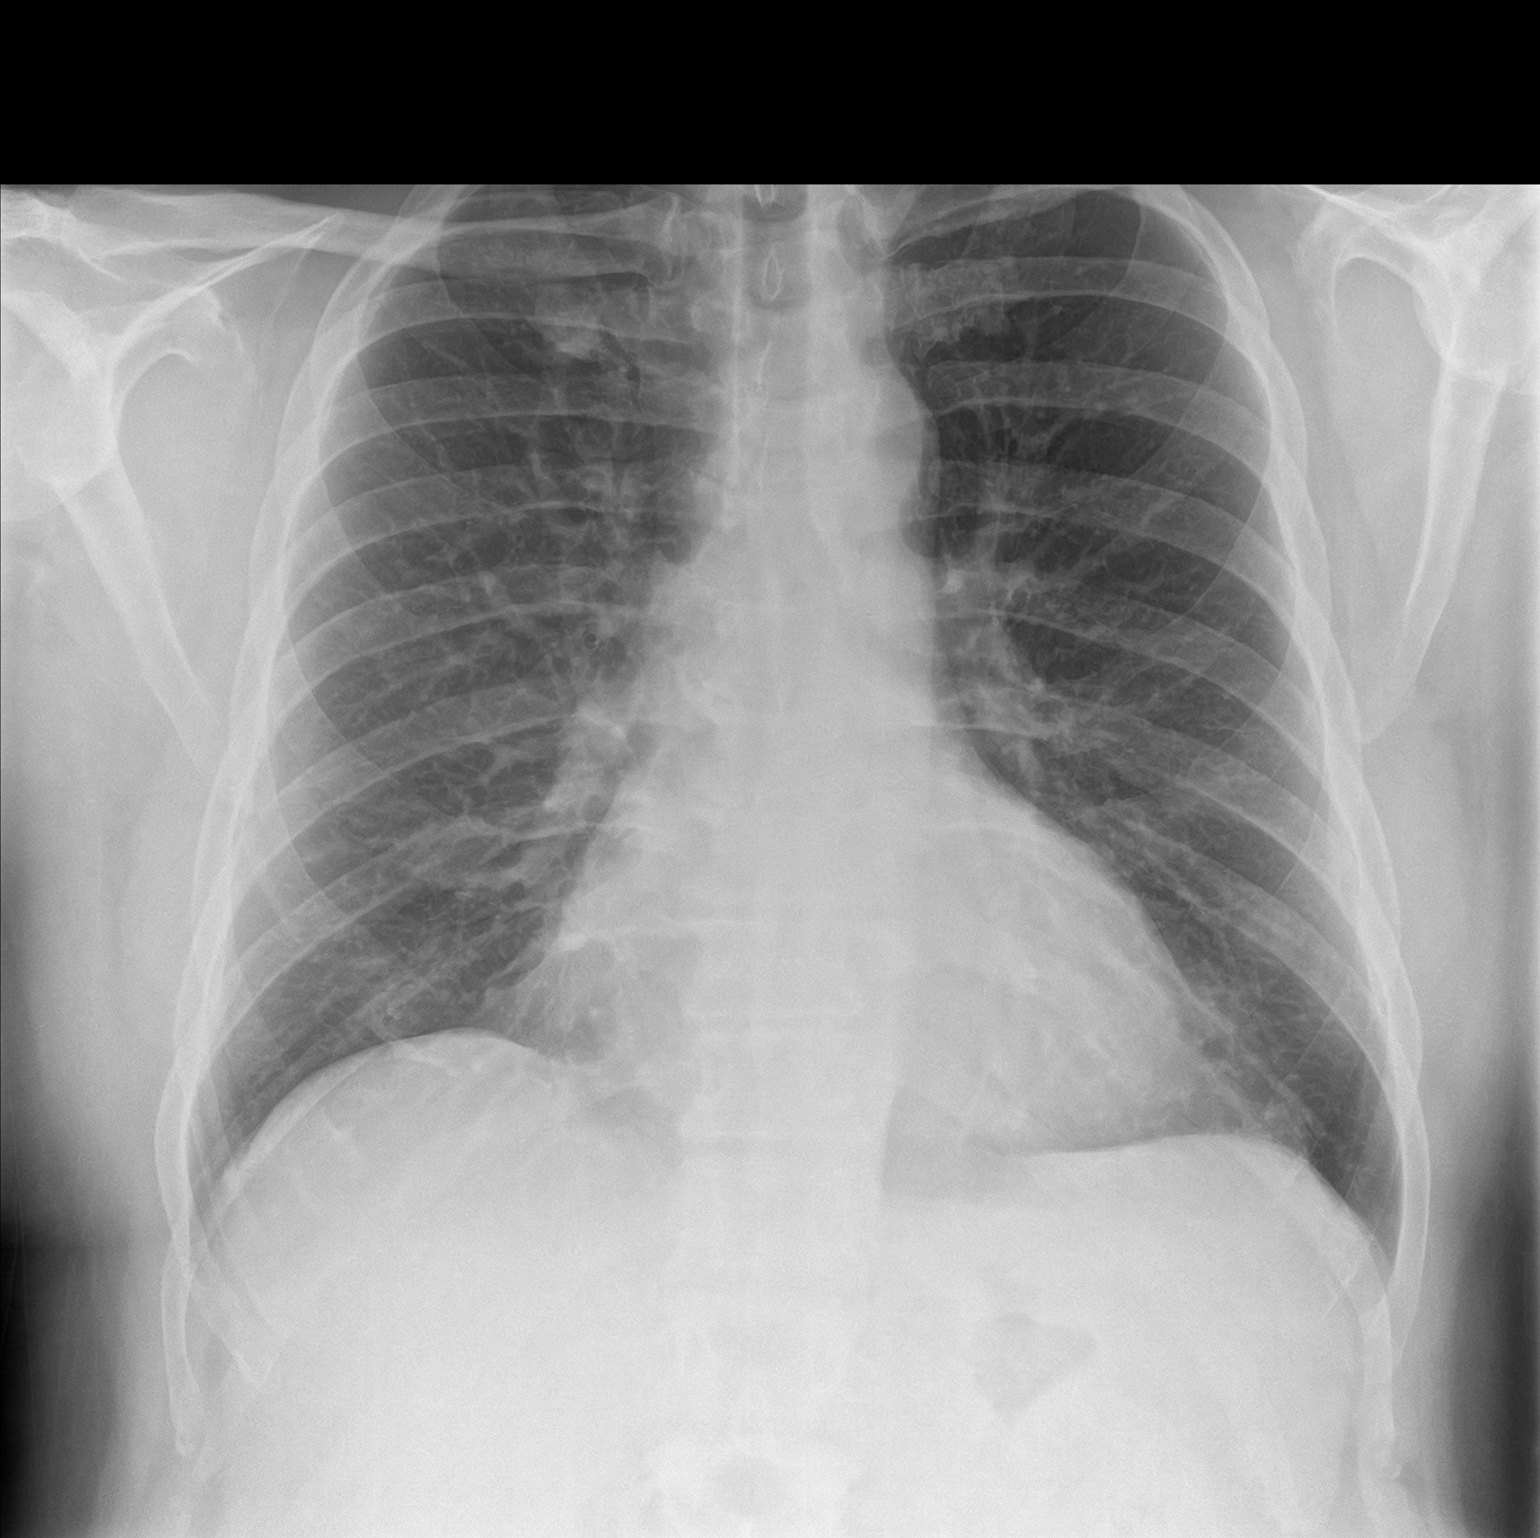
[im 2/2]
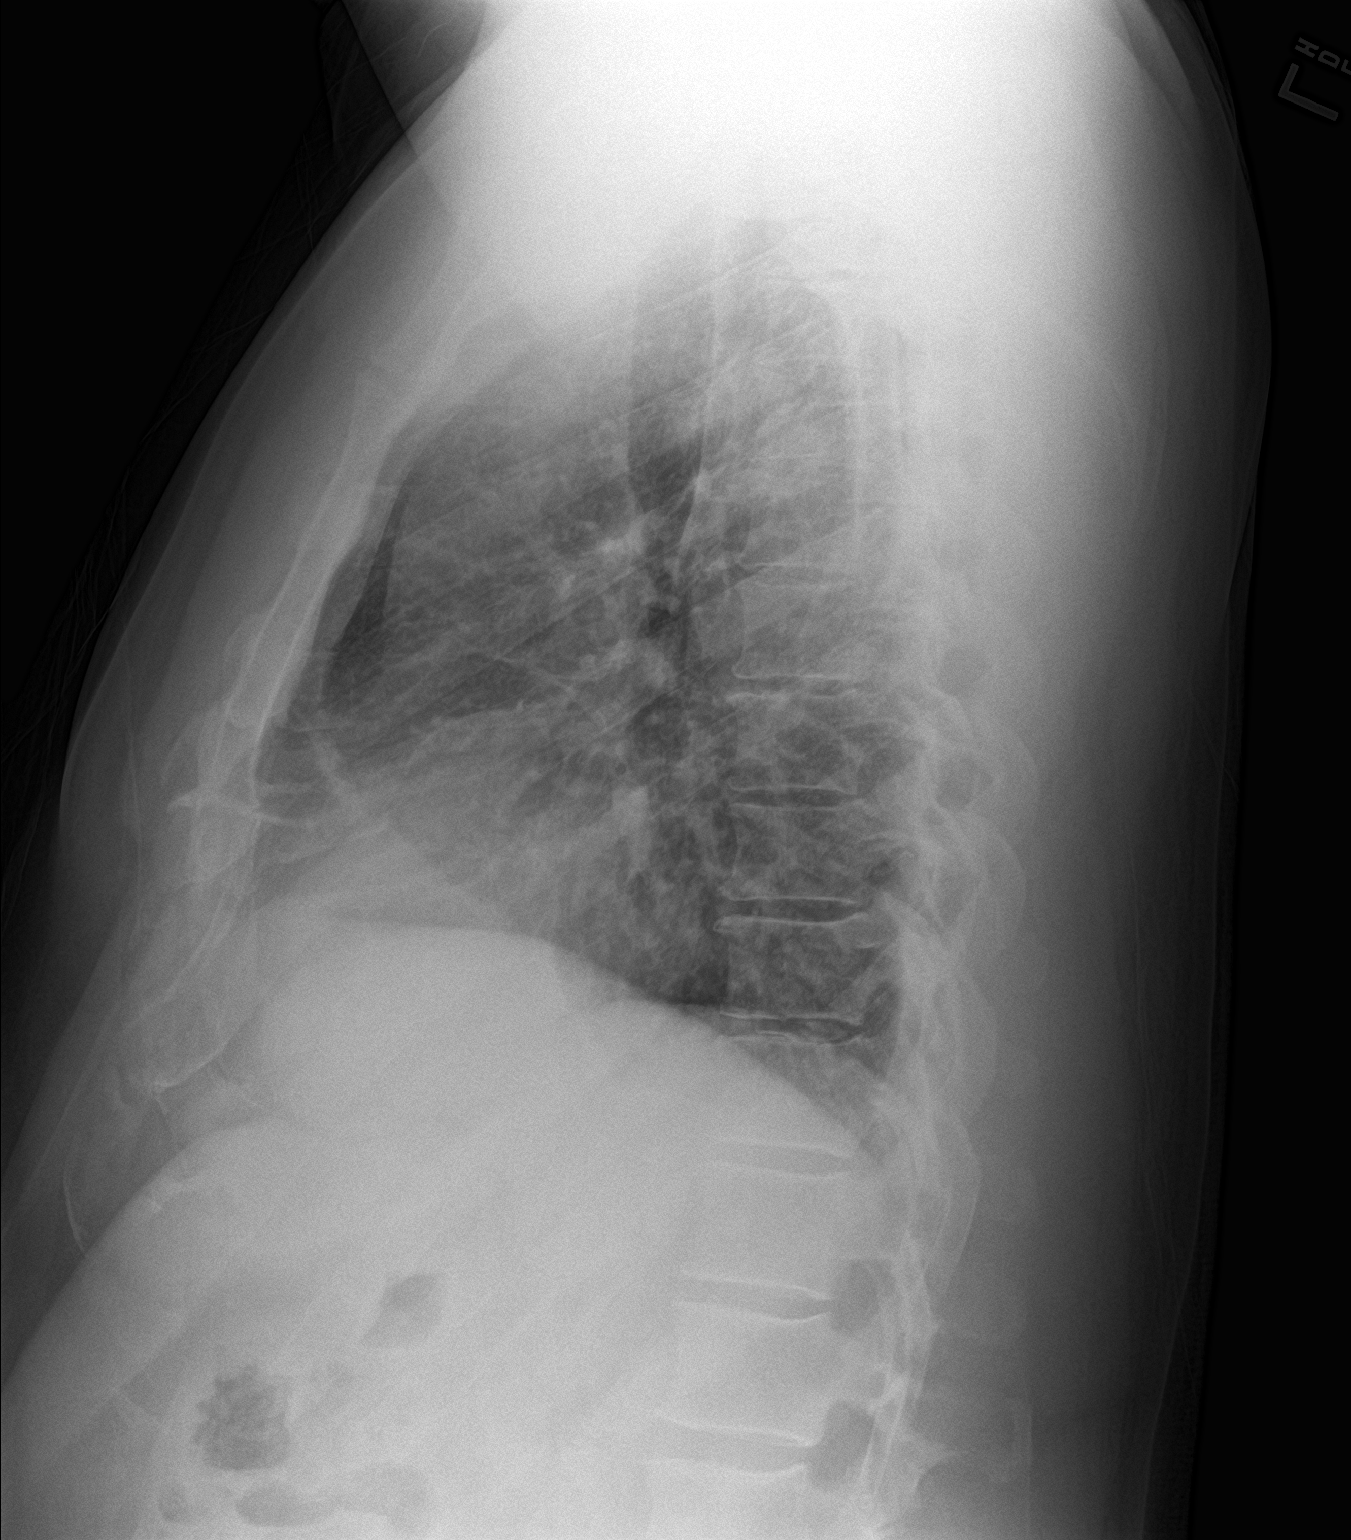

[2 of 2 positions shown; findings below may reference images not displayed]

FINDINGS: Borderline enlargement of cardiac silhouette.

Mediastinal contours and pulmonary vascularity normal.

Lungs clear.

No pleural effusion or pneumothorax.

Osseous structures unremarkable.
IMPRESSION: No acute abnormalities.

Borderline enlargement of cardiac silhouette.

## 2021-01-09 ENCOUNTER — Emergency Department
Admission: EM | Admit: 2021-01-09 | Discharge: 2021-01-09 | Disposition: A | Discharge status: Home / Self Care | Payer: BC Managed Care – PPO | Attending: Emergency Medicine | Admitting: Emergency Medicine

## 2021-01-09 ENCOUNTER — Emergency Department: Payer: BC Managed Care – PPO

## 2021-01-09 ENCOUNTER — Other Ambulatory Visit: Payer: Self-pay

## 2021-01-09 DIAGNOSIS — N200 Calculus of kidney: Secondary | ICD-10-CM | POA: Insufficient documentation

## 2021-01-09 DIAGNOSIS — I10 Essential (primary) hypertension: Secondary | ICD-10-CM | POA: Diagnosis not present

## 2021-01-09 DIAGNOSIS — Z87891 Personal history of nicotine dependence: Secondary | ICD-10-CM | POA: Insufficient documentation

## 2021-01-09 DIAGNOSIS — Z79899 Other long term (current) drug therapy: Secondary | ICD-10-CM | POA: Diagnosis not present

## 2021-01-09 DIAGNOSIS — N130 Hydronephrosis with ureteropelvic junction obstruction: Secondary | ICD-10-CM | POA: Insufficient documentation

## 2021-01-09 DIAGNOSIS — I2510 Atherosclerotic heart disease of native coronary artery without angina pectoris: Secondary | ICD-10-CM | POA: Insufficient documentation

## 2021-01-09 DIAGNOSIS — Z7982 Long term (current) use of aspirin: Secondary | ICD-10-CM | POA: Diagnosis not present

## 2021-01-09 DIAGNOSIS — R109 Unspecified abdominal pain: Secondary | ICD-10-CM | POA: Diagnosis present

## 2021-01-09 LAB — URINALYSIS, COMPLETE (UACMP) WITH MICROSCOPIC
Bacteria, UA: NONE SEEN
Bilirubin Urine: NEGATIVE
Glucose, UA: NEGATIVE mg/dL
Ketones, ur: NEGATIVE mg/dL
Leukocytes,Ua: NEGATIVE
Nitrite: NEGATIVE
Protein, ur: NEGATIVE mg/dL
RBC / HPF: >50 RBC/hpf — ABNORMAL HIGH (ref 0–5)
Specific Gravity, Urine: 1.018 (ref 1.005–1.030)
Squamous Epithelial / HPF: NONE SEEN (ref 0–5)
pH: 5 (ref 5.0–8.0)

## 2021-01-09 LAB — CBC
HCT: 43.6 % (ref 39.0–52.0)
Hemoglobin: 15.6 g/dL (ref 13.0–17.0)
MCH: 29.1 pg (ref 26.0–34.0)
MCHC: 35.8 g/dL (ref 30.0–36.0)
MCV: 81.3 fL (ref 80.0–100.0)
Platelets: 201 10*3/uL (ref 150–400)
RBC: 5.36 MIL/uL (ref 4.22–5.81)
RDW: 12.6 % (ref 11.5–15.5)
WBC: 8.1 10*3/uL (ref 4.0–10.5)
nRBC: 0 % (ref 0.0–0.2)

## 2021-01-09 LAB — BASIC METABOLIC PANEL
Anion gap: 3 — ABNORMAL LOW (ref 5–15)
BUN: 21 mg/dL — ABNORMAL HIGH (ref 6–20)
CO2: 24 mmol/L (ref 22–32)
Calcium: 9.2 mg/dL (ref 8.9–10.3)
Chloride: 110 mmol/L (ref 98–111)
Creatinine, Ser: 1.08 mg/dL (ref 0.61–1.24)
GFR, Estimated: >60 mL/min (ref >60)
Glucose, Bld: 130 mg/dL — ABNORMAL HIGH (ref 70–99)
Potassium: 4.4 mmol/L (ref 3.5–5.1)
Sodium: 137 mmol/L (ref 135–145)

## 2021-01-09 MED ORDER — KETOROLAC TROMETHAMINE 30 MG/ML IJ SOLN
15.0000 mg | Freq: Once | INTRAMUSCULAR | Status: AC
  Administered 2021-01-09: 15 mg via INTRAVENOUS
  Filled 2021-01-09: qty 1

## 2021-01-09 MED ORDER — ONDANSETRON 4 MG PO TBDP: 4.0000 mg | ORAL_TABLET | Freq: Three times a day (TID) | ORAL | 0 refills | Status: DC | PRN

## 2021-01-09 MED ORDER — ONDANSETRON HCL 4 MG/2ML IJ SOLN
4.0000 mg | Freq: Once | INTRAMUSCULAR | Status: AC
  Administered 2021-01-09: 4 mg via INTRAVENOUS
  Filled 2021-01-09: qty 2

## 2021-01-09 MED ORDER — SODIUM CHLORIDE 0.9 % IV BOLUS
1000.0000 mL | Freq: Once | INTRAVENOUS | Status: AC
  Administered 2021-01-09: 1000 mL via INTRAVENOUS

## 2021-01-09 MED ORDER — OXYCODONE-ACETAMINOPHEN 5-325 MG PO TABS
1.0000 | ORAL_TABLET | Freq: Once | ORAL | Status: AC
Start: 2021-01-09 — End: 2021-01-09
  Administered 2021-01-09: 1 via ORAL
  Filled 2021-01-09: qty 1

## 2021-01-09 MED ORDER — OXYCODONE-ACETAMINOPHEN 5-325 MG PO TABS: 1.0000 | ORAL_TABLET | ORAL | 0 refills | Status: DC | PRN

## 2021-01-09 NOTE — ED Provider Notes (Signed)
[facility] Emergency Department Provider Note   ____________________________________________   Event Date/Time   First MD Initiated Contact with Patient 01/09/21 1036     (approximate)  I have reviewed the triage vital signs and the nursing notes.   HISTORY  Chief Complaint Flank Pain    HPI EDKER PUNT is a 55 y.o. male with past medical history of hypertension and CAD who presents to the ED complaining of flank pain.  Patient reports that he developed pain in his left flank last night that has been intermittent since then.  It will seem to ease up at times but has become more severe this morning.  Pain is sharp, not exacerbated or alleviated by anything in particular.  It has been associated with nausea, but he denies any vomiting or changes in bowel movements.  He has not been only able to urinate a small amount since onset of symptoms, denies dysuria or hematuria.  He describes current symptoms as similar to prior kidney stones.        Past Medical History:  Diagnosis Date   Alcohol abuse    Arthritis    right knee grade 3 out of 4   Coronary artery disease    Hypertension    MI (myocardial infarction) (HCC)    Pre-diabetes    S/P coronary artery stent placement 10/2016   Tobacco use     Patient Active Problem List   Diagnosis Date Noted   Chest pain    Cardiac arrest, cause unspecified (HCC)    STEMI (ST elevation myocardial infarction) (HCC) 10/30/2016   Sinusitis 11/17/2012   Tobacco use disorder 06/30/2011    Past Surgical History:  Procedure Laterality Date   ANTERIOR CRUCIATE LIGAMENT REPAIR  [id number]   right   APPENDECTOMY  1998   ARTHROSCOPIC REPAIR ACL     COLONOSCOPY WITH PROPOFOL N/A 05/13/2018   Procedure: COLONOSCOPY WITH PROPOFOL;  Surgeon: Toledo, Boykin Nearing, MD;  Location: [facility] ENDOSCOPY;  Service: Gastroenterology;  Laterality: N/A;   CORONARY STENT INTERVENTION N/A 10/30/2016   Procedure: Coronary Stent  Intervention;  Surgeon: Marcina Millard, MD;  Location: [facility] INVASIVE CV LAB;  Service: Cardiovascular;  Laterality: N/A;   CORONARY STENT INTERVENTION N/A 10/31/2016   Procedure: Coronary Stent Intervention;  Surgeon: Marcina Millard, MD;  Location: [facility] INVASIVE CV LAB;  Service: Cardiovascular;  Laterality: N/A;   KNEE SURGERY     LEFT HEART CATH AND CORONARY ANGIOGRAPHY N/A 10/30/2016   Procedure: Left Heart Cath and Coronary Angiography;  Surgeon: Marcina Millard, MD;  Location: [facility] INVASIVE CV LAB;  Service: Cardiovascular;  Laterality: N/A;   LEFT HEART CATH AND CORONARY ANGIOGRAPHY N/A 10/31/2016   Procedure: Left Heart Cath and Coronary Angiography;  Surgeon: Marcina Millard, MD;  Location: [facility] INVASIVE CV LAB;  Service: Cardiovascular;  Laterality: N/A;    Prior to Admission medications   Medication Sig Start Date End Date Taking? Authorizing Provider  ondansetron (ZOFRAN ODT) 4 MG disintegrating tablet Take 1 tablet (4 mg total) by mouth every 8 (eight) hours as needed for nausea or vomiting. 01/09/21  Yes Chesley Noon, MD  oxyCODONE-acetaminophen (PERCOCET) 5-325 MG tablet Take 1 tablet by mouth every 4 (four) hours as needed for severe pain. 01/09/21 01/09/22 Yes Chesley Noon, MD  acetaminophen (TYLENOL) 500 MG tablet Take 500 mg by mouth every 6 (six) hours as needed.    [provider]  aspirin 81 MG chewable tablet Chew 1 tablet (81 mg total) by mouth daily. 11/04/16  Milagros Loll, MD  atorvastatin (LIPITOR) 40 MG tablet Take 1 tablet (40 mg total) by mouth daily at 6 PM. Patient not taking: Reported on 05/13/2018 11/03/16   Milagros Loll, MD  etodolac (LODINE) 400 MG tablet Take 400 mg by mouth 2 (two) times daily.    [provider]  IBUPROFEN PO Take by mouth as needed.    [provider]  lisinopril (PRINIVIL,ZESTRIL) 2.5 MG tablet Take 1 tablet (2.5 mg total) by mouth daily. 11/04/16   Milagros Loll, MD  metoprolol succinate  (TOPROL-XL) 25 MG 24 hr tablet Take 25 mg by mouth daily.  11/09/16 05/13/18  [provider]  NAPROXEN PO Take 220 mg by mouth as needed.    [provider]  nitroGLYCERIN (NITROSTAT) 0.4 MG SL tablet Place 0.4 mg under the tongue every 5 (five) minutes as needed for chest pain.    [provider]  pravastatin (PRAVACHOL) 40 MG tablet Take 40 mg by mouth daily.    [provider]  ticagrelor (BRILINTA) 90 MG TABS tablet Take 1 tablet (90 mg total) by mouth 2 (two) times daily. 11/03/16   Milagros Loll, MD    Allergies Patient has no known allergies.  Family History  Problem Relation Age of Onset   Arthritis Father    Diabetes Father    Heart disease Father    Hypertension Mother    Heart failure Mother    Pulmonary fibrosis Mother    Prostate cancer Paternal Uncle    Breast cancer Neg Hx    Colon cancer Neg Hx     Social History Social History   Tobacco Use   Smoking status: Former    Packs/day: 0.50    Years: 30.00    Pack years: 15.00    Types: Cigarettes    Quit date: 10/30/2016    Years since quitting: 4.1   Smokeless tobacco: Never   Tobacco comments:    Remains Quit 12/14/2016  Vaping Use   Vaping Use: Never used  Substance Use Topics   Alcohol use: Yes    Comment: occ   Drug use: No    Review of Systems  Constitutional: No fever/chills Eyes: No visual changes. ENT: No sore throat. Cardiovascular: Denies chest pain. Respiratory: Denies shortness of breath. Gastrointestinal: No abdominal pain.  Positive for flank pain and nausea, no vomiting.  No diarrhea.  No constipation. Genitourinary: Negative for dysuria. Musculoskeletal: Negative for back pain. Skin: Negative for rash. Neurological: Negative for headaches, focal weakness or numbness.  ____________________________________________   PHYSICAL EXAM:  VITAL SIGNS: ED Triage Vitals [01/09/21 0946]  Enc Vitals Group     BP (!) 155/97     Pulse Rate (!) 44     Resp  18     Temp 97.8 F (36.6 C)     Temp Source Oral     SpO2 97 %     Weight 266 lb (120.7 kg)     Height 6\' 1"  (1.854 m)     Head Circumference      Peak Flow      Pain Score 10     Pain Loc      Pain Edu?      Excl. in GC?     Constitutional: Alert and oriented. Eyes: Conjunctivae are normal. Head: Atraumatic. Nose: No congestion/rhinnorhea. Mouth/Throat: Mucous membranes are moist. Neck: Normal ROM Cardiovascular: Normal rate, regular rhythm. Grossly normal heart sounds. Respiratory: Normal respiratory effort.  No retractions. Lungs CTAB. Gastrointestinal: Soft and  nontender.  No CVA tenderness noted bilaterally.  No distention. Genitourinary: deferred Musculoskeletal: No lower extremity tenderness nor edema. Neurologic:  Normal speech and language. No gross focal neurologic deficits are appreciated. Skin:  Skin is warm, dry and intact. No rash noted. Psychiatric: Mood and affect are normal. Speech and behavior are normal.  ____________________________________________   LABS (all labs ordered are listed, but only abnormal results are displayed)  Labs Reviewed  URINALYSIS, COMPLETE (UACMP) WITH MICROSCOPIC - Abnormal; Notable for the following components:      Result Value   Color, Urine YELLOW (*)    APPearance CLEAR (*)    Hgb urine dipstick LARGE (*)    RBC / HPF >50 (*)    All other components within normal limits  BASIC METABOLIC PANEL - Abnormal; Notable for the following components:   Glucose, Bld 130 (*)    BUN 21 (*)    Anion gap 3 (*)    All other components within normal limits  CBC   ____________________________________________  EKG  ED ECG REPORT I, Chesley Noon, the attending physician, personally viewed and interpreted this ECG.   Date: 01/09/2021  EKG Time: 9:51  Rate: 47  Rhythm: sinus bradycardia  Axis: Normal  Intervals:left anterior fascicular block  ST&T Change: None   PROCEDURES  Procedure(s) performed (including Critical  Care):  Procedures   ____________________________________________   INITIAL IMPRESSION / ASSESSMENT AND PLAN / ED COURSE      55 year old male with past medical history of hypertension and CAD who presents to the ED complaining of intermittent left flank pain since last night.  Patient with no abdominal or CVA tenderness on exam, symptoms seem most consistent with a kidney stone and we will further assess with CT scan.  Labs are unremarkable, UA shows hematuria but no signs of infection.  We will hydrate with IV fluids, treat symptomatically with IV Zofran and Toradol.  CT scan shows 2 mm stone near the left UVJ with mild associated hydronephrosis.  Patient feeling somewhat better following IV Toradol, was given follow-up dose of Percocet with improvement in pain.  He is tolerating p.o. without difficulty at this time and is appropriate for discharge home with urology follow-up as needed.  He was counseled to return to the ED for new worsening symptoms, patient agrees with plan.      ____________________________________________   FINAL CLINICAL IMPRESSION(S) / ED DIAGNOSES  Final diagnoses:  Left flank pain  Kidney stone     ED Discharge Orders          Ordered    oxyCODONE-acetaminophen (PERCOCET) 5-325 MG tablet  Every 4 hours PRN        01/09/21 1257    ondansetron (ZOFRAN ODT) 4 MG disintegrating tablet  Every 8 hours PRN        01/09/21 1257             Note:  This document was prepared using Dragon voice recognition software and may include unintentional dictation errors.    Chesley Noon, MD 01/09/21 [phone]

## 2021-01-09 NOTE — ED Notes (Signed)
Pt sent from [facility] for concerns of kidney stone

## 2021-01-09 NOTE — ED Notes (Signed)
See triage note  Presents with flank pain  States pain started some last pm  Became worse this am  Hx of renal stones

## 2021-01-09 NOTE — ED Triage Notes (Signed)
Pt here with left side flank pain that started yesterday. Pt believes that it may be a kidney stone. Pt has had stones in the past. Pt states pain radiates around to the front of his abd.

## 2021-01-10 ENCOUNTER — Ambulatory Visit: Payer: Self-pay | Admitting: *Deleted

## 2021-01-10 ENCOUNTER — Encounter (HOSPITAL_BASED_OUTPATIENT_CLINIC_OR_DEPARTMENT_OTHER): Payer: Self-pay | Admitting: Emergency Medicine

## 2021-01-10 ENCOUNTER — Emergency Department (HOSPITAL_BASED_OUTPATIENT_CLINIC_OR_DEPARTMENT_OTHER)
Admission: EM | Admit: 2021-01-10 | Discharge: 2021-01-10 | Disposition: A | Discharge status: Home / Self Care | Payer: BC Managed Care – PPO | Attending: Emergency Medicine | Admitting: Emergency Medicine

## 2021-01-10 DIAGNOSIS — N201 Calculus of ureter: Secondary | ICD-10-CM | POA: Insufficient documentation

## 2021-01-10 DIAGNOSIS — Z87891 Personal history of nicotine dependence: Secondary | ICD-10-CM | POA: Insufficient documentation

## 2021-01-10 DIAGNOSIS — Z79899 Other long term (current) drug therapy: Secondary | ICD-10-CM | POA: Insufficient documentation

## 2021-01-10 DIAGNOSIS — R109 Unspecified abdominal pain: Secondary | ICD-10-CM

## 2021-01-10 DIAGNOSIS — Z7982 Long term (current) use of aspirin: Secondary | ICD-10-CM | POA: Diagnosis not present

## 2021-01-10 DIAGNOSIS — I2510 Atherosclerotic heart disease of native coronary artery without angina pectoris: Secondary | ICD-10-CM | POA: Diagnosis not present

## 2021-01-10 DIAGNOSIS — I10 Essential (primary) hypertension: Secondary | ICD-10-CM | POA: Insufficient documentation

## 2021-01-10 LAB — BASIC METABOLIC PANEL
Anion gap: 6 (ref 5–15)
BUN: 17 mg/dL (ref 6–20)
CO2: 25 mmol/L (ref 22–32)
Calcium: 8.5 mg/dL — ABNORMAL LOW (ref 8.9–10.3)
Chloride: 105 mmol/L (ref 98–111)
Creatinine, Ser: 1.03 mg/dL (ref 0.61–1.24)
GFR, Estimated: >60 mL/min (ref >60)
Glucose, Bld: 128 mg/dL — ABNORMAL HIGH (ref 70–99)
Potassium: 4.1 mmol/L (ref 3.5–5.1)
Sodium: 136 mmol/L (ref 135–145)

## 2021-01-10 LAB — CBC WITH DIFFERENTIAL/PLATELET
Abs Immature Granulocytes: 0.03 10*3/uL (ref 0.00–0.07)
Basophils Absolute: 0 10*3/uL (ref 0.0–0.1)
Basophils Relative: 0 %
Eosinophils Absolute: 0 10*3/uL (ref 0.0–0.5)
Eosinophils Relative: 0 %
HCT: 43.4 % (ref 39.0–52.0)
Hemoglobin: 15.6 g/dL (ref 13.0–17.0)
Immature Granulocytes: 0 %
Lymphocytes Relative: 12 %
Lymphs Abs: 1.4 10*3/uL (ref 0.7–4.0)
MCH: 29.6 pg (ref 26.0–34.0)
MCHC: 35.9 g/dL (ref 30.0–36.0)
MCV: 82.4 fL (ref 80.0–100.0)
Monocytes Absolute: 0.7 10*3/uL (ref 0.1–1.0)
Monocytes Relative: 6 %
Neutro Abs: 9.1 10*3/uL — ABNORMAL HIGH (ref 1.7–7.7)
Neutrophils Relative %: 82 %
Platelets: 189 10*3/uL (ref 150–400)
RBC: 5.27 MIL/uL (ref 4.22–5.81)
RDW: 12.6 % (ref 11.5–15.5)
WBC: 11.2 10*3/uL — ABNORMAL HIGH (ref 4.0–10.5)
nRBC: 0 % (ref 0.0–0.2)

## 2021-01-10 MED ORDER — METOCLOPRAMIDE HCL 10 MG PO TABS: 10.0000 mg | ORAL_TABLET | Freq: Four times a day (QID) | ORAL | 0 refills | Status: DC

## 2021-01-10 MED ORDER — ONDANSETRON HCL 4 MG/2ML IJ SOLN
4.0000 mg | Freq: Once | INTRAMUSCULAR | Status: AC
  Administered 2021-01-10: 4 mg via INTRAVENOUS
  Filled 2021-01-10: qty 2

## 2021-01-10 MED ORDER — MORPHINE SULFATE (PF) 4 MG/ML IV SOLN
4.0000 mg | Freq: Once | INTRAVENOUS | Status: AC
Start: 2021-01-10 — End: 2021-01-10
  Administered 2021-01-10: 4 mg via INTRAVENOUS
  Filled 2021-01-10: qty 1

## 2021-01-10 MED ORDER — HYDROMORPHONE HCL 1 MG/ML IJ SOLN
0.5000 mg | Freq: Once | INTRAMUSCULAR | Status: AC
Start: 2021-01-10 — End: 2021-01-10
  Administered 2021-01-10: 0.5 mg via INTRAVENOUS
  Filled 2021-01-10: qty 1

## 2021-01-10 MED ORDER — SODIUM CHLORIDE 0.9 % IV BOLUS
1000.0000 mL | Freq: Once | INTRAVENOUS | Status: AC
  Administered 2021-01-10: 1000 mL via INTRAVENOUS

## 2021-01-10 MED ORDER — TAMSULOSIN HCL 0.4 MG PO CAPS: 0.4000 mg | ORAL_CAPSULE | Freq: Every day | ORAL | 0 refills | Status: AC

## 2021-01-10 NOTE — Discharge Instructions (Addendum)
Please pick up  medication and take as prescribed. I would recommend taking the anti nausea medication at least 30 minutes before attempting to eat/take the pain medication.   Make sure to have some food in your system before taking the narcotic as taking this on an empty stomach can cause nausea and vomiting.  Drink plenty of fluids to stay hydrated  Follow up with urology for further evaluation   Return to the ED IMMEDIATELY for any new/worsening symptoms including worsening pain, persistent vomiting, inability to urinate, fevers > 100.4, or any other new/concerning symptoms

## 2021-01-10 NOTE — Telephone Encounter (Signed)
Reason for Disposition . Patient sounds very sick or weak to the triager    Diagnosed with kidney stones yesterday left flank in the ED.   Now vomiting and can't keep pain or nausea medicine down.   In sever pain and vomiting this morning.  Answer Assessment - Initial Assessment Questions 1. VOMITING SEVERITY: "How many times have you vomited in the past 24 hours?"     - MILD:  1 - 2 times/day    - MODERATE: 3 - 5 times/day, decreased oral intake without significant weight loss or symptoms of dehydration    - SEVERE: 6 or more times/day, vomits everything or nearly everything, with significant weight loss, symptoms of dehydration      Wife called in [facility].   Went to ED yesterday and CT show a kidney stone.   They gave him IV fluids and nausea and pain medication.   He was discharged. 2. ONSET: "When did the vomiting begin?"      He vomited last night.  I gave him Zofran it did not help because he can't keep it or the pain medicine down.   He is in severe pain again in his left lower back area like yesterday.     He can't keep down any fluids or medications.  Went to [facility] yesterday.   Can he go to the [facility] ER on [address]?    Triage stopped at this point since they know the problem from being evaluated yesterday in the ED.   I let her know they could go to the [facility] on [address] today even though they went to [facility] yesterday.   It's also a [place] facility.    She thanked me very much for my help.   I'll take him there now. 3. FLUIDS: "What fluids or food have you vomited up today?" "Have you been able to keep any fluids down?"     *No Answer* 4. ABDOMINAL PAIN: "Are your having any abdominal pain?" If yes : "How bad is it and what does it feel like?" (e.g., crampy, dull, intermittent, constant)      *No Answer* 5. DIARRHEA: "Is there any diarrhea?" If Yes, ask: "How many times today?"      *No Answer* 6. CONTACTS: "Is  there anyone else in the family with the same symptoms?"      *No Answer* 7. CAUSE: "What do you think is causing your vomiting?"     *No Answer* 8. HYDRATION STATUS: "Any signs of dehydration?" (e.g., dry mouth [not only dry lips], too weak to stand) "When did you last urinate?"     *No Answer* 9. OTHER SYMPTOMS: "Do you have any other symptoms?" (e.g., fever, headache, vertigo, vomiting blood or coffee grounds, recent head injury)     *No Answer* 10. PREGNANCY: "Is there any chance you are pregnant?" "When was your last menstrual period?"       *No Answer*  Protocols used: Vomiting-A-AH

## 2021-01-10 NOTE — ED Notes (Signed)
Unsuccessful IV attempt x2.  

## 2021-01-10 NOTE — ED Provider Notes (Signed)
[facility] EMERGENCY DEPARTMENT Provider Note   CSN: [id number] Arrival date & time: 01/10/21  0932     History Chief Complaint  Patient presents with   Flank Pain    William Bates is a 55 y.o. male with PMHx HTN, CAD, and kidney stones who presents to the ED today with worsening left flank pain and persistent nausea/NBNB emesis since last night. Pt was seen at [facility] yesterday for left flank/back pain and diagnosed with a 2 mm stone at the UVJ. He was treated with toradol, zofran, and percocet in the ED and discharged home with percocet and zofran. Pt reports that he still had pain upon discharge yesterday. He went home and a few hours later took another percocet. Pt admits that he had not had anything to eat or drink during the day and shortly afterwards began feeling nauseated. He tried to take the zofran ODT however states after that he began vomiting and has vomited several times since then. He states he is now unable to keep anything down prompting return ED visit today. He does state he was vomiting light brown material however states he did try to drink a coke to settle his stomach and attributes it to same. Denies coffee ground emesis. Pt reports previous stone 12 years ago which he passed on his own. He denies fevers, chills, urinary retention.   The history is provided by the patient and medical records.      Past Medical History:  Diagnosis Date   Alcohol abuse    Arthritis    right knee grade 3 out of 4   Coronary artery disease    Hypertension    MI (myocardial infarction) (HCC)    Pre-diabetes    S/P coronary artery stent placement 10/2016   Tobacco use     Patient Active Problem List   Diagnosis Date Noted   Chest pain    Cardiac arrest, cause unspecified (HCC)    STEMI (ST elevation myocardial infarction) (HCC) 10/30/2016   Sinusitis 11/17/2012   Tobacco use disorder 06/30/2011    Past Surgical History:  Procedure Laterality Date   ANTERIOR  CRUCIATE LIGAMENT REPAIR  [id number]   right   APPENDECTOMY  1998   ARTHROSCOPIC REPAIR ACL     COLONOSCOPY WITH PROPOFOL N/A 05/13/2018   Procedure: COLONOSCOPY WITH PROPOFOL;  Surgeon: Toledo, Boykin Nearing, MD;  Location: [facility] ENDOSCOPY;  Service: Gastroenterology;  Laterality: N/A;   CORONARY STENT INTERVENTION N/A 10/30/2016   Procedure: Coronary Stent Intervention;  Surgeon: Marcina Millard, MD;  Location: [facility] INVASIVE CV LAB;  Service: Cardiovascular;  Laterality: N/A;   CORONARY STENT INTERVENTION N/A 10/31/2016   Procedure: Coronary Stent Intervention;  Surgeon: Marcina Millard, MD;  Location: [facility] INVASIVE CV LAB;  Service: Cardiovascular;  Laterality: N/A;   KNEE SURGERY     LEFT HEART CATH AND CORONARY ANGIOGRAPHY N/A 10/30/2016   Procedure: Left Heart Cath and Coronary Angiography;  Surgeon: Marcina Millard, MD;  Location: [facility] INVASIVE CV LAB;  Service: Cardiovascular;  Laterality: N/A;   LEFT HEART CATH AND CORONARY ANGIOGRAPHY N/A 10/31/2016   Procedure: Left Heart Cath and Coronary Angiography;  Surgeon: Marcina Millard, MD;  Location: [facility] INVASIVE CV LAB;  Service: Cardiovascular;  Laterality: N/A;       Family History  Problem Relation Age of Onset   Arthritis Father    Diabetes Father    Heart disease Father    Hypertension Mother    Heart failure Mother    Pulmonary fibrosis  Mother    Prostate cancer Paternal Uncle    Breast cancer Neg Hx    Colon cancer Neg Hx     Social History   Tobacco Use   Smoking status: Former    Packs/day: 0.50    Years: 30.00    Pack years: 15.00    Types: Cigarettes    Quit date: 10/30/2016    Years since quitting: 4.2   Smokeless tobacco: Never   Tobacco comments:    Remains Quit 12/14/2016  Vaping Use   Vaping Use: Never used  Substance Use Topics   Alcohol use: Yes    Comment: occ   Drug use: No    Home Medications Prior to Admission medications   Medication Sig Start Date End Date Taking?  Authorizing Provider  metoCLOPramide (REGLAN) 10 MG tablet Take 1 tablet (10 mg total) by mouth every 6 (six) hours. 01/10/21  Yes Brenon Antosh, PA-C  tamsulosin (FLOMAX) 0.4 MG CAPS capsule Take 1 capsule (0.4 mg total) by mouth daily for 7 days. 01/10/21 01/17/21 Yes Mirna Sutcliffe, PA-C  acetaminophen (TYLENOL) 500 MG tablet Take 500 mg by mouth every 6 (six) hours as needed.    [provider]  aspirin 81 MG chewable tablet Chew 1 tablet (81 mg total) by mouth daily. 11/04/16   Milagros Loll, MD  atorvastatin (LIPITOR) 40 MG tablet Take 1 tablet (40 mg total) by mouth daily at 6 PM. Patient not taking: Reported on 05/13/2018 11/03/16   Milagros Loll, MD  etodolac (LODINE) 400 MG tablet Take 400 mg by mouth 2 (two) times daily.    [provider]  IBUPROFEN PO Take by mouth as needed.    [provider]  lisinopril (PRINIVIL,ZESTRIL) 2.5 MG tablet Take 1 tablet (2.5 mg total) by mouth daily. 11/04/16   Milagros Loll, MD  metoprolol succinate (TOPROL-XL) 25 MG 24 hr tablet Take 25 mg by mouth daily.  11/09/16 05/13/18  [provider]  NAPROXEN PO Take 220 mg by mouth as needed.    [provider]  nitroGLYCERIN (NITROSTAT) 0.4 MG SL tablet Place 0.4 mg under the tongue every 5 (five) minutes as needed for chest pain.    [provider]  ondansetron (ZOFRAN ODT) 4 MG disintegrating tablet Take 1 tablet (4 mg total) by mouth every 8 (eight) hours as needed for nausea or vomiting. 01/09/21   Chesley Noon, MD  oxyCODONE-acetaminophen (PERCOCET) 5-325 MG tablet Take 1 tablet by mouth every 4 (four) hours as needed for severe pain. 01/09/21 01/09/22  Chesley Noon, MD  pravastatin (PRAVACHOL) 40 MG tablet Take 40 mg by mouth daily.    [provider]  ticagrelor (BRILINTA) 90 MG TABS tablet Take 1 tablet (90 mg total) by mouth 2 (two) times daily. 11/03/16   Milagros Loll, MD    Allergies    Patient has no known allergies.  Review of Systems    Review of Systems  Constitutional:  Negative for chills and fever.  Gastrointestinal:  Positive for nausea and vomiting.  Genitourinary:  Positive for flank pain. Negative for difficulty urinating.  All other systems reviewed and are negative.  Physical Exam Updated Vital Signs BP 129/84   Pulse (!) 48   Temp 98.4 F (36.9 C) (Oral)   Resp 18   SpO2 97%   Physical Exam Vitals and nursing note reviewed.  Constitutional:      Appearance: He is not ill-appearing or diaphoretic.     Comments: Uncomfortable appearing male, laying on  left side  HENT:     Head: Normocephalic and atraumatic.  Eyes:     Conjunctiva/sclera: Conjunctivae normal.  Cardiovascular:     Rate and Rhythm: Normal rate and regular rhythm.     Pulses: Normal pulses.  Pulmonary:     Effort: Pulmonary effort is normal.     Breath sounds: Normal breath sounds. No wheezing, rhonchi or rales.  Abdominal:     Palpations: Abdomen is soft.     Tenderness: There is no abdominal tenderness. There is left CVA tenderness. There is no guarding or rebound.  Musculoskeletal:     Cervical back: Neck supple.  Skin:    General: Skin is warm and dry.  Neurological:     Mental Status: He is alert.    ED Results / Procedures / Treatments   Labs (all labs ordered are listed, but only abnormal results are displayed) Labs Reviewed  CBC WITH DIFFERENTIAL/PLATELET - Abnormal; Notable for the following components:      Result Value   WBC 11.2 (*)    Neutro Abs 9.1 (*)    All other components within normal limits  BASIC METABOLIC PANEL - Abnormal; Notable for the following components:   Glucose, Bld 128 (*)    Calcium 8.5 (*)    All other components within normal limits    EKG None  Radiology CT Renal Stone Study  Result Date: 01/09/2021 CLINICAL DATA:  Left flank pain, kidney stone suspected. EXAM: CT ABDOMEN AND PELVIS WITHOUT CONTRAST TECHNIQUE: Multidetector CT imaging of the abdomen and pelvis was performed  following the standard protocol without IV contrast. COMPARISON:  None. FINDINGS: Lower chest: No acute abnormality. Hepatobiliary: No focal liver abnormality is seen. No gallstones, gallbladder wall thickening, or biliary dilatation. Pancreas: Unremarkable. No pancreatic ductal dilatation or surrounding inflammatory changes. Spleen: Normal in size without focal abnormality. Adrenals/Urinary Tract: Mild left hydroureteronephrosis with mild perinephric fat stranding secondary to a 0.2 x 0 x 0 2 cm calcified stone in the distal left ureter, approximately 0.5 cm proximal to the UVJ. No right hydronephrosis or right renal. No additional stones noted urinary bladder. Stomach/Bowel: Stomach is within normal limits. No evidence of bowel wall thickening, distention, or inflammatory changes. Vascular/Lymphatic: Mild aortic atherosclerosis. No enlarged abdominal or pelvic lymph nodes. Reproductive: Prostate is unremarkable. Other: Small bilateral fat containing hernias. No abdominopelvic ascites. Musculoskeletal: No acute or significant osseous findings. IMPRESSION: Mild left hydroureteronephrosis secondary to a 0.2 cm calcified stone in the distal left ureter. No additional left renal stones or bladder stones are identified. Critical Value/emergent results were called by telephone at the time of interpretation on 01/09/2021 at 12:03 pm to provider [facility] , who verbally acknowledged these results. Aortic Atherosclerosis (ICD10-I70.0). Electronically Signed   By: Sherron Ales MD   On: 01/09/2021 12:12    Procedures Procedures   Medications Ordered in ED Medications  ondansetron [facility]) injection 4 mg (4 mg Intravenous Given 01/10/21 1054)  morphine 4 MG/ML injection 4 mg (4 mg Intravenous Given 01/10/21 1055)  sodium chloride 0.9 % bolus 1,000 mL ( Intravenous Stopped 01/10/21 1158)  HYDROmorphone (DILAUDID) injection 0.5 mg (0.5 mg Intravenous Given 01/10/21 1130)    ED Course  I have reviewed the triage vital  signs and the nursing notes.  Pertinent labs & imaging results that were available during my care of the patient were reviewed by me and considered in my medical decision making (see chart for details).    MDM Rules/Calculators/A&P  55 year old male who presents to the ED today with ongoing left-sided flank pain and persistent nausea/vomiting since being diagnosed with a 2 mm stone at the [facility] yesterday at [facility].  On arrival to the ED today patient is afebrile.  He is bradycardic however appears to have history of same.  Remainder vitals unremarkable.  Patient does appear uncomfortable on exam, laying on his left side.  He is noted to have left CVA tenderness palpation.  Will provide pain relief, fluids, antiemetics and repeat labs at this time.  Do not feel he needs repeat imaging given he just had a CT scan yesterday.  If able to tolerate p.o. we will plan to discharge home.  I do suspect that he likely began having vomiting secondary to taking narcotic on an empty stomach.   CBC and BMP unchanged from yesterday.  Pt able to achieve good pain relief at 4 mg morphine and 0.5 mg dilaudid. He has successfully been fluid challenged and able to tolerate PO. Will plan to discharge home at this time with close outpatient urology follow up. Will discharge with additional nausea medication and flomax given hydro seen on CT scan yesterday. Pt instructed on strict return precautions. He is in agreement with plan and stable for discharge home.   This note was prepared using Dragon voice recognition software and may include unintentional dictation errors due to the inherent limitations of voice recognition software.   Final Clinical Impression(s) / ED Diagnoses Final diagnoses:  Left flank pain  Ureterolithiasis    Rx / DC Orders ED Discharge Orders          Ordered    metoCLOPramide (REGLAN) 10 MG tablet  Every 6 hours        01/10/21 1244    tamsulosin (FLOMAX)  0.4 MG CAPS capsule  Daily        01/10/21 1244             Discharge Instructions      Please pick up  medication and take as prescribed. I would recommend taking the anti nausea medication at least 30 minutes before attempting to eat/take the pain medication.   Make sure to have some food in your system before taking the narcotic as taking this on an empty stomach can cause nausea and vomiting.  Drink plenty of fluids to stay hydrated  Follow up with urology for further evaluation   Return to the ED IMMEDIATELY for any new/worsening symptoms including worsening pain, persistent vomiting, inability to urinate, fevers > 100.4, or any other new/concerning symptoms       Tanda RockersVenter, Shawneequa Baldridge, PA-C 01/10/21 1246    Gloris Manchesterixon, Ryan, MD 01/12/21 [phone]

## 2021-01-10 NOTE — Telephone Encounter (Signed)
Wife William Bates called in.   She was out walking the dog while talking with me trying to get back home.   She wanted to know if she could take him to the ED at [facility] instead of [facility] where he was seen yesterday and diagnosed with a left kidney stone.   He is in pain and vomiting this morning because he can't keep the pain or Zofran, nausea medicine down.     I let her know she could take him there.  His records would be available.   She thanked me very much for my help.   The ED at [facility] location is much closer than returning to [facility] but she didn't want to have to go through all the exam again, etc.   I let her know he would not since it's a [facility] facility too and they would be able to see the results of the CT scan, etc.     She thanked me very much for my help.

## 2021-01-10 NOTE — ED Triage Notes (Signed)
PT reports left sided flank pain. PT was seen yesterday at [facility] and Dx with kidney stone. Pt unable to keep pain medication down at home due to vomiting.

## 2021-01-10 NOTE — ED Notes (Signed)
Pt provided with water and saltines for PO challenge. 

## 2021-01-12 ENCOUNTER — Other Ambulatory Visit: Payer: Self-pay

## 2021-01-12 ENCOUNTER — Ambulatory Visit (INDEPENDENT_AMBULATORY_CARE_PROVIDER_SITE_OTHER): Payer: BC Managed Care – PPO | Admitting: Urology

## 2021-01-12 ENCOUNTER — Encounter: Payer: Self-pay | Admitting: Urology

## 2021-01-12 VITALS — Ht 73.0 in | Wt 266.0 lb

## 2021-01-12 DIAGNOSIS — N200 Calculus of kidney: Secondary | ICD-10-CM

## 2021-01-12 NOTE — Patient Instructions (Signed)
Dietary Guidelines to Help Prevent Kidney Stones Kidney stones are deposits of minerals and salts that form inside your kidneys. Your risk of developing kidney stones may be greater depending on your diet, your lifestyle, the medicines you take, and whether you have certain medical conditions. Most people can lower their chances of developing kidney stones by following the instructions below. Your dietitian may give you more specific instructions depending on your overall health and the type of kidney stones you tend to develop. What are tips for following this plan? Reading food labels  Choose foods with "no salt added" or "low-salt" labels. Limit your salt (sodium) intake to less than 1,500 mg a day. Choose foods with calcium for each meal and snack. Try to eat about 300 mg of calcium at each meal. Foods that contain 200-500 mg of calcium a serving include: 8 oz (237 mL) of milk, calcium-fortifiednon-dairy milk, and calcium-fortifiedfruit juice. Calcium-fortified means that calcium has been added to these drinks. 8 oz (237 mL) of kefir, yogurt, and soy yogurt. 4 oz (114 g) of tofu. 1 oz (28 g) of cheese. 1 cup (150 g) of dried figs. 1 cup (91 g) of cooked broccoli. One 3 oz (85 g) can of sardines or mackerel. Most people need 1,000-1,500 mg of calcium a day. Talk to your dietitian about how much calcium is recommended for you. Shopping Buy plenty of fresh fruits and vegetables. Most people do not need to avoid fruits and vegetables, even if these foods contain nutrients that may contribute to kidney stones. When shopping for convenience foods, choose: Whole pieces of fruit. Pre-made salads with dressing on the side. Low-fat fruit and yogurt smoothies. Avoid buying frozen meals or prepared deli foods. These can be high in sodium. Look for foods with live cultures, such as yogurt and kefir. Choose high-fiber grains, such as whole-wheat breads, oat bran, and wheat cereals. Cooking Do not add  salt to food when cooking. Place a salt shaker on the table and allow each person to add his or her own salt to taste. Use vegetable protein, such as beans, textured vegetable protein (TVP), or tofu, instead of meat in pasta, casseroles, and soups. Meal planning Eat less salt, if told by your dietitian. To do this: Avoid eating processed or pre-made food. Avoid eating fast food. Eat less animal protein, including cheese, meat, poultry, or fish, if told by your dietitian. To do this: Limit the number of times you have meat, poultry, fish, or cheese each week. Eat a diet free of meat at least 2 days a week. Eat only one serving each day of meat, poultry, fish, or seafood. When you prepare animal protein, cut pieces into small portion sizes. For most meat and fish, one serving is about the size of the palm of your hand. Eat at least five servings of fresh fruits and vegetables each day. To do this: Keep fruits and vegetables on hand for snacks. Eat one piece of fruit or a handful of berries with breakfast. Have a salad and fruit at lunch. Have two kinds of vegetables at dinner. Limit foods that are high in a substance called oxalate. These include: Spinach (cooked), rhubarb, beets, sweet potatoes, and Swiss chard. Peanuts. Potato chips, french fries, and baked potatoes with skin on. Nuts and nut products. Chocolate. If you regularly take a diuretic medicine, make sure to eat at least 1 or 2 servings of fruits or vegetables that are high in potassium each day. These include: Avocado. Banana. Orange, prune,   carrot, or tomato juice. Baked potato. Cabbage. Beans and split peas. Lifestyle  Drink enough fluid to keep your urine pale yellow. This is the most important thing you can do. Spread your fluid intake throughout the day. If you drink alcohol: Limit how much you use to: 0-1 drink a day for women who are not pregnant. 0-2 drinks a day for men. Be aware of how much alcohol is in your  drink. In the [place] one drink equals one 12 oz bottle of beer (355 mL), one 5 oz glass of wine (148 mL), or one 1 oz glass of hard liquor (44 mL). Lose weight if told by your health care provider. Work with your dietitian to find an eating plan and weight loss strategies that work best for you. General information Talk to your health care provider and dietitian about taking daily supplements. You may be told the following depending on your health and the cause of your kidney stones: Not to take supplements with vitamin C. To take a calcium supplement. To take a daily probiotic supplement. To take other supplements such as magnesium, fish oil, or vitamin B6. Take over-the-counter and prescription medicines only as told by your health care provider. These include supplements. What foods should I limit? Limit your intake of the following foods, or eat them as told by your dietitian. Vegetables Spinach. Rhubarb. Beets. Canned vegetables. Pickles. Olives. Baked potatoes with skin. Grains Wheat bran. Baked goods. Salted crackers. Cereals high in sugar. Meats and other proteins Nuts. Nut butters. Large portions of meat, poultry, or fish. Salted, precooked, or cured meats, such as sausages, meat loaves, and hot dogs. Dairy Cheese. Beverages Regular soft drinks. Regular vegetable juice. Seasonings and condiments Seasoning blends with salt. Salad dressings. Soy sauce. Ketchup. Barbecue sauce. Other foods Canned soups. Canned pasta sauce. Casseroles. Pizza. Lasagna. Frozen meals. Potato chips. French fries. The items listed above may not be a complete list of foods and beverages you should limit. Contact a dietitian for more information. What foods should I avoid? Talk to your dietitian about specific foods you should avoid based on the type of kidney stones you have and your overall health. Fruits Grapefruit. The item listed above may not be a complete list of foods and beverages you should  avoid. Contact a dietitian for more information. Summary Kidney stones are deposits of minerals and salts that form inside your kidneys. You can lower your risk of kidney stones by making changes to your diet. The most important thing you can do is drink enough fluid. Drink enough fluid to keep your urine pale yellow. Talk to your dietitian about how much calcium you should have each day, and eat less salt and animal protein as told by your dietitian. This information is not intended to replace advice given to you by your health care provider. Make sure you discuss any questions you have with your health care provider. Document Revised: 05/14/2019 Document Reviewed: 05/14/2019 Elsevier Patient Education  2022 [employer]  

## 2021-01-12 NOTE — Progress Notes (Signed)
01/12/21 4:12 PM   William Bates Oct 18, 1965 [id number]  CC: Left ureteral stone  HPI: 55 year old male with history of CAD who presented to the ED on 01/09/2021 with severe left-sided flank pain.  CT showed a 2 mm left distal ureteral stone with upstream hydronephrosis, and no other renal stones.  Urinalysis showed microscopic hematuria, CBC and BMP were benign, and he was discharged with medical expulsive therapy.  He returned to the ER later that day for worsening nausea after taking narcotic on an empty stomach.  He currently is doing much better.  He has had only mild pain today on the left side and not since this morning.  The narcotic works the best for him for pain.  He is also taking the Zofran as needed for any nausea.  He has a history of 1 to spontaneously passed stone about 15 years ago.   PMH: Past Medical History:  Diagnosis Date   Alcohol abuse    Arthritis    right knee grade 3 out of 4   Coronary artery disease    Hypertension    MI (myocardial infarction) (HCC)    Pre-diabetes    S/P coronary artery stent placement 10/2016   Tobacco use     Surgical History: Past Surgical History:  Procedure Laterality Date   ANTERIOR CRUCIATE LIGAMENT REPAIR  [phone]   right   APPENDECTOMY  1998   ARTHROSCOPIC REPAIR ACL     COLONOSCOPY WITH PROPOFOL N/A 05/13/2018   Procedure: COLONOSCOPY WITH PROPOFOL;  Surgeon: Toledo, Boykin Nearing, MD;  Location: [facility];  Service: Gastroenterology;  Laterality: N/A;   CORONARY STENT INTERVENTION N/A 10/30/2016   Procedure: Coronary Stent Intervention;  Surgeon: Marcina Millard, MD;  Location: [facility];  Service: Cardiovascular;  Laterality: N/A;   CORONARY STENT INTERVENTION N/A 10/31/2016   Procedure: Coronary Stent Intervention;  Surgeon: Marcina Millard, MD;  Location: [facility];  Service: Cardiovascular;  Laterality: N/A;   KNEE SURGERY     LEFT HEART CATH AND CORONARY ANGIOGRAPHY N/A  10/30/2016   Procedure: Left Heart Cath and Coronary Angiography;  Surgeon: Marcina Millard, MD;  Location: [facility];  Service: Cardiovascular;  Laterality: N/A;   LEFT HEART CATH AND CORONARY ANGIOGRAPHY N/A 10/31/2016   Procedure: Left Heart Cath and Coronary Angiography;  Surgeon: Marcina Millard, MD;  Location: [facility];  Service: Cardiovascular;  Laterality: N/A;   Family History: Family History  Problem Relation Age of Onset   Arthritis Father    Diabetes Father    Heart disease Father    Hypertension Mother    Heart failure Mother    Pulmonary fibrosis Mother    Prostate cancer Paternal Uncle    Breast cancer Neg Hx    Colon cancer Neg Hx     Social History:  reports that he quit smoking about 4 years ago. His smoking use included cigarettes. He has a 15.00 pack-year smoking history. He has never used smokeless tobacco. He reports current alcohol use. He reports that he does not use drugs.  Physical Exam: Ht 6\' 1"  (1.854 m)   Wt 266 lb (120.7 kg)   BMI 35.09 kg/m    Constitutional:  Alert and oriented, No acute distress. Cardiovascular: No clubbing, cyanosis, or edema. Respiratory: Normal respiratory effort, no increased work of breathing. GI: Abdomen is soft, nontender, nondistended, no abdominal masses  Laboratory Data: Reviewed, see HPI  Pertinent Imaging: I have personally viewed and interpreted the CT  stone protocol dated 01/09/2021 showing a 2 mm left distal ureteral stone with upstream hydronephrosis and no other renal stones.  Assessment & Plan:   55 year old male with 2 mm left distal ureteral stone and well controlled pain at this time.  No clinical or laboratory evidence of infection.  We discussed various treatment options for urolithiasis including observation with or without medical expulsive therapy, shockwave lithotripsy (SWL), ureteroscopy and laser lithotripsy with stent placement, and percutaneous nephrolithotomy.  We  discussed that management is based on stone size, location, density, patient co-morbidities, and patient preference.   Stones <48mm in size have a >80% spontaneous passage rate. Data surrounding the use of tamsulosin for medical expulsive therapy is controversial, but meta analyses suggests it is most efficacious for distal stones between 5-45mm in size. Possible side effects include dizziness/lightheadedness, and retrograde ejaculation.  SWL has a lower stone free rate in a single procedure, but also a lower complication rate compared to ureteroscopy and avoids a stent and associated stent related symptoms. Possible complications include renal hematoma, steinstrasse, and need for additional treatment.  Shockwave not a good option with his very small stone that would make targeting challenging, as well as location low in the pelvis.  Ureteroscopy with laser lithotripsy and stent placement has a higher stone free rate than SWL in a single procedure, however increased complication rate including possible infection, ureteral injury, bleeding, and stent related morbidity. Common stent related symptoms include dysuria, urgency/frequency, and flank pain.  We discussed general stone prevention strategies including adequate hydration with goal of producing 2.5 L of urine daily, increasing citric acid intake, increasing calcium intake during high oxalate meals, minimizing animal protein, and decreasing salt intake. Information about dietary recommendations given today.    He would like to continue medical expulsive therapy.  Return precautions discussed at length Continue Flomax, pain meds as needed, Zofran as needed  Legrand Rams, MD 01/12/2021  [facility] [address] [place], [place] [address] [phone]

## 2021-05-01 ENCOUNTER — Other Ambulatory Visit: Payer: Self-pay

## 2021-05-01 ENCOUNTER — Encounter (HOSPITAL_BASED_OUTPATIENT_CLINIC_OR_DEPARTMENT_OTHER): Payer: Self-pay | Admitting: Urology

## 2021-05-01 DIAGNOSIS — W268XXA Contact with other sharp object(s), not elsewhere classified, initial encounter: Secondary | ICD-10-CM | POA: Diagnosis not present

## 2021-05-01 DIAGNOSIS — Z5321 Procedure and treatment not carried out due to patient leaving prior to being seen by health care provider: Secondary | ICD-10-CM | POA: Insufficient documentation

## 2021-05-01 DIAGNOSIS — S6991XA Unspecified injury of right wrist, hand and finger(s), initial encounter: Secondary | ICD-10-CM | POA: Diagnosis present

## 2021-05-01 DIAGNOSIS — S61011A Laceration without foreign body of right thumb without damage to nail, initial encounter: Secondary | ICD-10-CM | POA: Diagnosis not present

## 2021-05-01 NOTE — ED Triage Notes (Signed)
Right thumb lac from corn can,  Bleeding controlled, Last Tetanus unknown.

## 2021-05-02 ENCOUNTER — Emergency Department (HOSPITAL_BASED_OUTPATIENT_CLINIC_OR_DEPARTMENT_OTHER)
Admission: EM | Admit: 2021-05-02 | Discharge: 2021-05-02 | Disposition: A | Discharge status: Home / Self Care | Payer: BC Managed Care – PPO | Attending: Emergency Medicine | Admitting: Emergency Medicine

## 2021-11-24 ENCOUNTER — Ambulatory Visit
Admission: EM | Admit: 2021-11-24 | Discharge: 2021-11-24 | Disposition: A | Discharge status: Home / Self Care | Payer: BC Managed Care – PPO | Attending: Urgent Care | Admitting: Urgent Care

## 2021-11-24 DIAGNOSIS — J019 Acute sinusitis, unspecified: Secondary | ICD-10-CM | POA: Diagnosis not present

## 2021-11-24 MED ORDER — PSEUDOEPHEDRINE HCL 60 MG PO TABS: 60.0000 mg | ORAL_TABLET | Freq: Three times a day (TID) | ORAL | 0 refills | Status: DC | PRN

## 2021-11-24 MED ORDER — CETIRIZINE HCL 10 MG PO TABS: 10.0000 mg | ORAL_TABLET | Freq: Every day | ORAL | 0 refills | Status: DC

## 2021-11-24 MED ORDER — AMOXICILLIN-POT CLAVULANATE 875-125 MG PO TABS: 1.0000 | ORAL_TABLET | Freq: Two times a day (BID) | ORAL | 0 refills | Status: DC

## 2021-12-13 ENCOUNTER — Ambulatory Visit
Admission: EM | Admit: 2021-12-13 | Discharge: 2021-12-13 | Disposition: A | Discharge status: Home / Self Care | Payer: BC Managed Care – PPO | Attending: Emergency Medicine | Admitting: Emergency Medicine

## 2021-12-13 DIAGNOSIS — J029 Acute pharyngitis, unspecified: Secondary | ICD-10-CM | POA: Diagnosis present

## 2021-12-13 DIAGNOSIS — J309 Allergic rhinitis, unspecified: Secondary | ICD-10-CM | POA: Diagnosis present

## 2021-12-13 DIAGNOSIS — H9203 Otalgia, bilateral: Secondary | ICD-10-CM | POA: Diagnosis present

## 2021-12-13 LAB — POCT RAPID STREP A (OFFICE): Rapid Strep A Screen: NEGATIVE

## 2021-12-13 MED ORDER — FLUTICASONE PROPIONATE 50 MCG/ACT NA SUSP: 1.0000 | Freq: Every day | NASAL | 2 refills | Status: AC

## 2021-12-13 MED ORDER — CETIRIZINE HCL 10 MG PO TABS: 10.0000 mg | ORAL_TABLET | Freq: Every day | ORAL | 1 refills | Status: AC

## 2021-12-13 MED ORDER — IPRATROPIUM BROMIDE 0.06 % NA SOLN: 2.0000 | Freq: Three times a day (TID) | NASAL | 1 refills | Status: AC

## 2021-12-13 NOTE — ED Triage Notes (Signed)
Recent sinus infection, treated with antibiotics. Cleared up for two weeks. Went swimming this weekend, got water in both ears. Ears hurting, throat sore. No fever, no exudate.

## 2021-12-13 NOTE — ED Provider Notes (Signed)
[facility]    CSN: [id number] Arrival date & time: 12/13/21  0801    HISTORY   Chief Complaint  Patient presents with   Otalgia   HPI William Bates is a pleasant, 56 y.o. male who presents to urgent care today complaining of pain in both ears and sore throat.  Patient denies fever.  Patient was seen on June 23 and treated with Augmentin for 7 days for presumed bacterial sinus infection that he had been dealing with for about a week, he states after taking Augmentin, his symptoms resolved "eventually".  Patient states he went swimming in a swimming pool 4 days ago and is certain that he got water in his ears.  Patient states that 2 days ago, he used Debrox and hydrogen peroxide to clean wax from his ears with some mild improvement, states he thinks he got most of the wax out and is confident that all the water came out.  Patient states that yesterday he began to develop sore throat.  Patient states he does not have difficulty swallowing but does feel that there is something back there, tries to cough up what he feels like is a big wad of mucus that will not move.  Patient states his ears feel full but denies hearing loss.  Patient denies fever, aches, chills, nausea, vomiting, diarrhea, known sick contacts other than his wife, his wife who is with him today, states this possible they have been passing a virus back-and-forth.  The history is provided by the patient.   Past Medical History:  Diagnosis Date   Alcohol abuse    Arthritis    right knee grade 3 out of 4   Coronary artery disease    Hypertension    MI (myocardial infarction) (HCC)    Pre-diabetes    S/P coronary artery stent placement 10/2016   Tobacco use    Patient Active Problem List   Diagnosis Date Noted   Chest pain    Cardiac arrest, cause unspecified (HCC)    STEMI (ST elevation myocardial infarction) (HCC) 10/30/2016   Sinusitis 11/17/2012   Tobacco use disorder 06/30/2011   Past Surgical History:   Procedure Laterality Date   ANTERIOR CRUCIATE LIGAMENT REPAIR  [id number]   right   APPENDECTOMY  1998   ARTHROSCOPIC REPAIR ACL     COLONOSCOPY WITH PROPOFOL N/A 05/13/2018   Procedure: COLONOSCOPY WITH PROPOFOL;  Surgeon: Toledo, Boykin Nearingeodoro K, MD;  Location: [facility];  Service: Gastroenterology;  Laterality: N/A;   CORONARY STENT INTERVENTION N/A 10/30/2016   Procedure: Coronary Stent Intervention;  Surgeon: Marcina MillardParaschos, Alexander, MD;  Location: [facility];  Service: Cardiovascular;  Laterality: N/A;   CORONARY STENT INTERVENTION N/A 10/31/2016   Procedure: Coronary Stent Intervention;  Surgeon: Marcina MillardParaschos, Alexander, MD;  Location: [facility];  Service: Cardiovascular;  Laterality: N/A;   KNEE SURGERY     LEFT HEART CATH AND CORONARY ANGIOGRAPHY N/A 10/30/2016   Procedure: Left Heart Cath and Coronary Angiography;  Surgeon: Marcina MillardParaschos, Alexander, MD;  Location: [facility];  Service: Cardiovascular;  Laterality: N/A;   LEFT HEART CATH AND CORONARY ANGIOGRAPHY N/A 10/31/2016   Procedure: Left Heart Cath and Coronary Angiography;  Surgeon: Marcina MillardParaschos, Alexander, MD;  Location: [facility];  Service: Cardiovascular;  Laterality: N/A;    Home Medications    Prior to Admission medications   Medication Sig Start Date End Date Taking? Authorizing Provider  aspirin 81 MG chewable tablet Chew 1 tablet (81 mg  total) by mouth daily. 11/04/16   Milagros Loll, MD  cetirizine (ZYRTEC ALLERGY) 10 MG tablet Take 1 tablet (10 mg total) by mouth daily. 11/24/21   Wallis Bamberg, PA-C  lisinopril (PRINIVIL,ZESTRIL) 2.5 MG tablet Take 1 tablet (2.5 mg total) by mouth daily. 11/04/16   Milagros Loll, MD  metoprolol succinate (TOPROL-XL) 25 MG 24 hr tablet Take 25 mg by mouth daily.  11/09/16 05/13/18  [provider]  nitroGLYCERIN (NITROSTAT) 0.4 MG SL tablet Place 0.4 mg under the tongue every 5 (five) minutes as needed for chest pain.    [provider]   rosuvastatin (CRESTOR) 10 MG tablet Take 10 mg by mouth at bedtime. 11/29/20   [provider]    Family History Family History  Problem Relation Age of Onset   Arthritis Father    Diabetes Father    Heart disease Father    Hypertension Mother    Heart failure Mother    Pulmonary fibrosis Mother    Prostate cancer Paternal Uncle    Breast cancer Neg Hx    Colon cancer Neg Hx    Social History Social History   Tobacco Use   Smoking status: Former    Packs/day: 0.50    Years: 30.00    Total pack years: 15.00    Types: Cigarettes    Quit date: 10/30/2016    Years since quitting: 5.1   Smokeless tobacco: Never   Tobacco comments:    Remains Quit 12/14/2016  Vaping Use   Vaping Use: Never used  Substance Use Topics   Alcohol use: Yes    Comment: occ   Drug use: No   Allergies   Patient has no known allergies.  Review of Systems Review of Systems Pertinent findings revealed after performing a 14 point review of systems has been noted in the history of present illness.  Physical Exam Triage Vital Signs ED Triage Vitals  Enc Vitals Group     BP 03/31/21 0827 (!) 147/82     Pulse Rate 03/31/21 0827 72     Resp 03/31/21 0827 18     Temp 03/31/21 0827 98.3 F (36.8 C)     Temp Source 03/31/21 0827 Oral     SpO2 03/31/21 0827 98 %     Weight --      Height --      Head Circumference --      Peak Flow --      Pain Score 03/31/21 0826 5     Pain Loc --      Pain Edu? --      Excl. in GC? --   No data found.  Updated Vital Signs BP 125/89 (BP Location: Left Arm)   Pulse 62   Temp 97.7 F (36.5 C) (Oral)   Resp 16   SpO2 96%   Physical Exam Vitals and nursing note reviewed.  Constitutional:      General: He is not in acute distress.    Appearance: Normal appearance. He is not ill-appearing.  HENT:     Head: Normocephalic and atraumatic.     Salivary Glands: Right salivary gland is not diffusely enlarged or tender. Left salivary gland is not  diffusely enlarged or tender.     Right Ear: Ear canal and external ear normal. No drainage. A middle ear effusion is present. There is no impacted cerumen. Tympanic membrane is bulging. Tympanic membrane is not injected or erythematous.     Left Ear: Ear canal and external  ear normal. No drainage. A middle ear effusion is present. There is no impacted cerumen. Tympanic membrane is bulging. Tympanic membrane is not injected or erythematous.     Ears:     Comments: Bilateral EACs normal, both TMs bulging with clear fluid    Nose: Rhinorrhea present. No nasal deformity, septal deviation, signs of injury, nasal tenderness, mucosal edema or congestion. Rhinorrhea is clear.     Right Nostril: Occlusion present. No foreign body, epistaxis or septal hematoma.     Left Nostril: Occlusion present. No foreign body, epistaxis or septal hematoma.     Right Turbinates: Enlarged, swollen and pale.     Left Turbinates: Enlarged, swollen and pale.     Right Sinus: No maxillary sinus tenderness or frontal sinus tenderness.     Left Sinus: No maxillary sinus tenderness or frontal sinus tenderness.     Mouth/Throat:     Lips: Pink. No lesions.     Mouth: Mucous membranes are moist. No oral lesions.     Pharynx: Oropharynx is clear. Uvula midline. No posterior oropharyngeal erythema or uvula swelling.     Tonsils: No tonsillar exudate. 0 on the right. 0 on the left.     Comments: Postnasal drip Eyes:     General: Lids are normal.        Right eye: No discharge.        Left eye: No discharge.     Extraocular Movements: Extraocular movements intact.     Conjunctiva/sclera: Conjunctivae normal.     Right eye: Right conjunctiva is not injected.     Left eye: Left conjunctiva is not injected.  Neck:     Trachea: Trachea and phonation normal.  Cardiovascular:     Rate and Rhythm: Normal rate and regular rhythm.     Pulses: Normal pulses.     Heart sounds: Normal heart sounds. No murmur heard.    No friction  rub. No gallop.  Pulmonary:     Effort: Pulmonary effort is normal. No accessory muscle usage, prolonged expiration or respiratory distress.     Breath sounds: Normal breath sounds. No stridor, decreased air movement or transmitted upper airway sounds. No decreased breath sounds, wheezing, rhonchi or rales.  Chest:     Chest wall: No tenderness.  Musculoskeletal:        General: Normal range of motion.     Cervical back: Normal range of motion and neck supple. Normal range of motion.  Lymphadenopathy:     Cervical: No cervical adenopathy.  Skin:    General: Skin is warm and dry.     Findings: No erythema or rash.  Neurological:     General: No focal deficit present.     Mental Status: He is alert and oriented to person, place, and time.  Psychiatric:        Mood and Affect: Mood normal.        Behavior: Behavior normal.     Visual Acuity Right Eye Distance:   Left Eye Distance:   Bilateral Distance:    Right Eye Near:   Left Eye Near:    Bilateral Near:     [facility] Couse / Diagnostics / Procedures:     Radiology No results found.  Procedures Procedures (including critical care time) EKG  Pending results:  Labs Reviewed  CULTURE, GROUP A STREP ([facility])  NOVEL CORONAVIRUS, NAA  POCT RAPID STREP A (OFFICE)    Medications Ordered in UC: Medications - No data to display  UC Diagnoses /  Final Clinical Impressions(s)   I have reviewed the triage vital signs and the nursing notes.  Pertinent labs & imaging results that were available during my care of the patient were reviewed by me and considered in my medical decision making (see chart for details).    Final diagnoses:  Allergic rhinitis, unspecified seasonality, unspecified trigger  Acute pharyngitis, unspecified etiology  Otalgia of both ears   At patient's request, viral and bacterial testing performed, rapid strep test was negative, throat culture pending, COVID-19 test is pending.  Patient provided with a note to  be out of work while waiting for his results.  Patient advised of my physical exam findings my concern is that he is suffering from environmental allergies that I recommend he resume Zyrtec (which she states he is not currently taking even though this was prescribed for him during his visit in June) and to begin Flonase and Atrovent to alleviate the swelling of his turbinates and fluid buildup behind both eardrums and significant postnasal drip back of his throat overnight and making him feel like he needs to cough it up.  Prescriptions provided for his convenience.  Return precautions advised.  Will treat for  ED Prescriptions     Medication Sig Dispense Auth. Provider   ipratropium (ATROVENT) 0.06 % nasal spray Place 2 sprays into both nostrils 3 (three) times daily. As needed for nasal congestion, runny nose 15 mL Theadora Rama Scales, PA-C   fluticasone (FLONASE) 50 MCG/ACT nasal spray Place 1 spray into both nostrils daily. 15.8 mL Theadora Rama Scales, PA-C   cetirizine (ZYRTEC ALLERGY) 10 MG tablet Take 1 tablet (10 mg total) by mouth at bedtime. 90 tablet Theadora Rama Scales, [place]      I have reviewed the PDMP during this encounter.  Disposition Upon Discharge:  Condition: stable for discharge home Home: take medications as prescribed; routine discharge instructions as discussed; follow up as advised.  Patient presented with an acute illness with associated systemic symptoms and significant discomfort requiring urgent management. In my opinion, this is a condition that a prudent lay person (someone who possesses an average knowledge of health and medicine) may potentially expect to result in complications if not addressed urgently such as respiratory distress, impairment of bodily function or dysfunction of bodily organs.   Routine symptom specific, illness specific and/or disease specific instructions were discussed with the patient and/or caregiver at length.   As such, the  patient has been evaluated and assessed, work-up was performed and treatment was provided in alignment with urgent care protocols and evidence based medicine.  Patient/parent/caregiver has been advised that the patient may require follow up for further testing and treatment if the symptoms continue in spite of treatment, as clinically indicated and appropriate.  If the patient was tested for COVID-19, Influenza and/or RSV, then the patient/parent/guardian was advised to isolate at home pending the results of his/her diagnostic coronavirus test and potentially longer if they're positive. I have also advised pt that if his/her COVID-19 test returns positive, it's recommended to self-isolate for at least 10 days after symptoms first appeared AND until fever-free for 24 hours without fever reducer AND other symptoms have improved or resolved. Discussed self-isolation recommendations as well as instructions for household member/close contacts as per the [facility] and [place] DHHS, and also gave patient the COVID packet with this information.  Patient/parent/caregiver has been advised to return to the [facility] or PCP in 3-5 days if no better; to PCP or the Emergency Department if new  signs and symptoms develop, or if the current signs or symptoms continue to change or worsen for further workup, evaluation and treatment as clinically indicated and appropriate  The patient will follow up with their current PCP if and as advised. If the patient does not currently have a PCP we will assist them in obtaining one.   The patient may need specialty follow up if the symptoms continue, in spite of conservative treatment and management, for further workup, evaluation, consultation and treatment as clinically indicated and appropriate.  Patient/parent/caregiver verbalized understanding and agreement of plan as discussed.  All questions were addressed during visit.  Please see discharge instructions below for further details of  plan.  Discharge Instructions:   Discharge Instructions      You were tested for COVID-19 today.  The result of your COVID-19 test will be posted to your MyChart once it is complete, typically this takes 36 to 48 hours.    If your COVID-19 test is positive, please advise the nurse whether you are interested in taking the recommended antiviral for COVID-19 called Paxlovid.   Your strep test today is negative.  Streptococcal throat culture will be performed per our protocol.  The result of your throat culture will be posted to your MyChart once it is complete, this typically takes 3 to 5 days.  If your streptococcal throat culture is positive, you will be contacted by phone and antibiotics will be prescribed for you.  Based on my physical exam findings and the history you have provided  today, I do not recommend antibiotics at this time.  I do not believe the risks and side effects of antibiotics would outweigh any minimal benefit that they might provide.        Your symptoms and my physical exam findings are most concerning for exacerbation of undiagnosed environmental allergies. The lining of your nostrils, also known as your turbinates, appear enlarged (edematous) and a have a bluish hue which we often see an allergy suffers.  You also have a significant amount of postnasal drip which the provider you saw in June also documented.  Your eardrums, while not in the affected do contain more than the normal amount of clear drainage meaning that they are slightly bulging.  Your symptoms of feeling that you have a lot of mucus in the throat that you cannot clear out, feelings of pressure in your ears and history of sinus infection also support a diagnosis of environmental allergies.  I have enclosed some patient information that I hope you find helpful.  Please feel free to discuss my findings with your provider to see if they recommend further evaluation.  For allergies suffers, to avoid catching frequent  respiratory infections, having skin reactions, dealing with eye irritation, losing sleep, missing work, etc., due to uncontrolled allergies, it is important that albumin occasions are taken exactly as prescribed not "as needed".  Allergy medications are preventative, not corrective.   Please see the list below for recommended medications, dosages and frequencies to provide relief of current symptoms:    Zyrtec (cetirizine): This is an excellent second-generation antihistamine that helps to reduce respiratory inflammatory response to environmental allergens.  In some patients, this medication can cause daytime sleepiness so I recommend that you take 1 tablet daily at bedtime.     Flonase (fluticasone): This is a steroid nasal spray that you use once daily, 1 spray in each nare.  For the first 3 to 4 days, please consider using the spray twice daily  to expedite relief of your symptoms.  Steroids that are prescribed in the form of nasal sprays, inhaled powders and creams are topical and are not absorbed into the body and therefore will not raise your blood sugar or blood pressure, will not make you feel jumpy or irritable.  Flonase does not work well if you decide to use it only used as you feel you need to, it works best used on a daily basis.  After 3 to 5 days of use, you will notice significant reduction of the inflammation and mucus production that is currently being caused by exposure to allergens, whether seasonal or environmental.  The most common side effect of this medication is nosebleeds.  If you experience a nosebleed, please discontinue use for 1 week, then feel free to resume.  I have provided you with a prescription.     Atrovent (ipratropium): This is an excellent anticholinergic nasal decongestant spray I have added to your recommended nasal steroid that will not cause rebound congestion, please instill 2 sprays into each nare with each use.  Because nasal steroids can take several days before  they begin to provide full benefit, I recommend that you use this spray in addition to the nasal steroid prescribed for you.  Please use it after you have used your nasal steroid and repeat up to 4 times daily as needed.  I have provided you with a prescription for this medication.      If your insurance will not cover your allergy medications, please consider downloading the Good Rx app which is free.  You can find considerable discounts on prescription and over-the-counter medications.   Please follow-up within the next 5-7 days either with your primary care provider or urgent care if your symptoms do not resolve.  If you do not have a primary care provider, we will assist you in finding one.   Thank you for visiting urgent care today.  We appreciate the opportunity to participate in your care.       This office note has been dictated using Teaching laboratory technician.  Unfortunately, this method of dictation can sometimes lead to typographical or grammatical errors.  I apologize for your inconvenience in advance if this occurs.  Please do not hesitate to reach out to me if clarification is needed.      Theadora Rama Scales, [place] 12/13/21 [phone]

## 2021-12-13 NOTE — Discharge Instructions (Signed)
You were tested for COVID-19 today.  The result of your COVID-19 test will be posted to your MyChart once it is complete, typically this takes 36 to 48 hours.    If your COVID-19 test is positive, please advise the nurse whether you are interested in taking the recommended antiviral for COVID-19 called Paxlovid.   Your strep test today is negative.  Streptococcal throat culture will be performed per our protocol.  The result of your throat culture will be posted to your MyChart once it is complete, this typically takes 3 to 5 days.  If your streptococcal throat culture is positive, you will be contacted by phone and antibiotics will be prescribed for you.  Based on my physical exam findings and the history you have provided  today, I do not recommend antibiotics at this time.  I do not believe the risks and side effects of antibiotics would outweigh any minimal benefit that they might provide.        Your symptoms and my physical exam findings are most concerning for exacerbation of undiagnosed environmental allergies. The lining of your nostrils, also known as your turbinates, appear enlarged (edematous) and a have a bluish hue which we often see an allergy suffers.  You also have a significant amount of postnasal drip which the provider you saw in June also documented.  Your eardrums, while not in the affected do contain more than the normal amount of clear drainage meaning that they are slightly bulging.  Your symptoms of feeling that you have a lot of mucus in the throat that you cannot clear out, feelings of pressure in your ears and history of sinus infection also support a diagnosis of environmental allergies.  I have enclosed some patient information that I hope you find helpful.  Please feel free to discuss my findings with your provider to see if they recommend further evaluation.  For allergies suffers, to avoid catching frequent respiratory infections, having skin reactions, dealing with eye  irritation, losing sleep, missing work, etc., due to uncontrolled allergies, it is important that albumin occasions are taken exactly as prescribed not "as needed".  Allergy medications are preventative, not corrective.   Please see the list below for recommended medications, dosages and frequencies to provide relief of current symptoms:    Zyrtec (cetirizine): This is an excellent second-generation antihistamine that helps to reduce respiratory inflammatory response to environmental allergens.  In some patients, this medication can cause daytime sleepiness so I recommend that you take 1 tablet daily at bedtime.     Flonase (fluticasone): This is a steroid nasal spray that you use once daily, 1 spray in each nare.  For the first 3 to 4 days, please consider using the spray twice daily to expedite relief of your symptoms.  Steroids that are prescribed in the form of nasal sprays, inhaled powders and creams are topical and are not absorbed into the body and therefore will not raise your blood sugar or blood pressure, will not make you feel jumpy or irritable.  Flonase does not work well if you decide to use it only used as you feel you need to, it works best used on a daily basis.  After 3 to 5 days of use, you will notice significant reduction of the inflammation and mucus production that is currently being caused by exposure to allergens, whether seasonal or environmental.  The most common side effect of this medication is nosebleeds.  If you experience a nosebleed, please discontinue use for  1 week, then feel free to resume.  I have provided you with a prescription.     Atrovent (ipratropium): This is an excellent anticholinergic nasal decongestant spray I have added to your recommended nasal steroid that will not cause rebound congestion, please instill 2 sprays into each nare with each use.  Because nasal steroids can take several days before they begin to provide full benefit, I recommend that you use  this spray in addition to the nasal steroid prescribed for you.  Please use it after you have used your nasal steroid and repeat up to 4 times daily as needed.  I have provided you with a prescription for this medication.      If your insurance will not cover your allergy medications, please consider downloading the Good Rx app which is free.  You can find considerable discounts on prescription and over-the-counter medications.   Please follow-up within the next 5-7 days either with your primary care provider or urgent care if your symptoms do not resolve.  If you do not have a primary care provider, we will assist you in finding one.   Thank you for visiting urgent care today.  We appreciate the opportunity to participate in your care.

## 2021-12-15 LAB — NOVEL CORONAVIRUS, NAA: SARS-CoV-2, NAA: NOT DETECTED

## 2021-12-16 ENCOUNTER — Telehealth: Payer: Self-pay | Admitting: Emergency Medicine

## 2021-12-16 LAB — CULTURE, GROUP A STREP (THRC): Special Requests: NORMAL

## 2021-12-16 MED ORDER — AMOXICILLIN 500 MG PO CAPS: 1000.0000 mg | ORAL_CAPSULE | Freq: Every day | ORAL | 0 refills | Status: AC

## 2021-12-16 NOTE — Telephone Encounter (Signed)
Throat culture revealed streptococci, prescription for amoxicillin sent to pharmacy.

## 2023-01-20 ENCOUNTER — Emergency Department (HOSPITAL_BASED_OUTPATIENT_CLINIC_OR_DEPARTMENT_OTHER)
Admission: EM | Admit: 2023-01-20 | Discharge: 2023-01-20 | Disposition: A | Discharge status: Home / Self Care | Payer: BC Managed Care – PPO | Attending: Emergency Medicine | Admitting: Emergency Medicine

## 2023-01-20 ENCOUNTER — Other Ambulatory Visit: Payer: Self-pay

## 2023-01-20 ENCOUNTER — Encounter (HOSPITAL_BASED_OUTPATIENT_CLINIC_OR_DEPARTMENT_OTHER): Payer: Self-pay | Admitting: Emergency Medicine

## 2023-01-20 DIAGNOSIS — R059 Cough, unspecified: Secondary | ICD-10-CM | POA: Diagnosis present

## 2023-01-20 DIAGNOSIS — Z7982 Long term (current) use of aspirin: Secondary | ICD-10-CM | POA: Diagnosis not present

## 2023-01-20 DIAGNOSIS — U071 COVID-19: Secondary | ICD-10-CM | POA: Diagnosis not present

## 2023-01-20 LAB — RESP PANEL BY RT-PCR (RSV, FLU A&B, COVID)  RVPGX2
Influenza A by PCR: NEGATIVE
Influenza B by PCR: NEGATIVE
Resp Syncytial Virus by PCR: NEGATIVE
SARS Coronavirus 2 by RT PCR: POSITIVE — AB

## 2023-01-20 LAB — GROUP A STREP BY PCR: Group A Strep by PCR: NOT DETECTED

## 2023-01-20 NOTE — ED Provider Notes (Signed)
[facility] Provider Note   CSN: [id number] Arrival date & time: 01/20/23  1132     History  Chief Complaint  Patient presents with   URI    William Bates is a 57 y.o. male with concerns for URI like symptoms onset 4 days. Has sick contacts with similar symptoms and positive sick contacts with COVID. No meds tried at home. Has rhinorrhea, nasal congestion, cough, generalized body aches, sore throat. Denies trouble breathing or fever.    The history is provided by the patient. No language interpreter was used.       Home Medications Prior to Admission medications   Medication Sig Start Date End Date Taking? Authorizing Provider  aspirin 81 MG chewable tablet Chew 1 tablet (81 mg total) by mouth daily. 11/04/16   Milagros Loll, MD  cetirizine (ZYRTEC ALLERGY) 10 MG tablet Take 1 tablet (10 mg total) by mouth at bedtime. 12/13/21 06/11/22  Theadora Rama Scales, PA-C  fluticasone (FLONASE) 50 MCG/ACT nasal spray Place 1 spray into both nostrils daily. 12/13/21   Theadora Rama Scales, PA-C  ipratropium (ATROVENT) 0.06 % nasal spray Place 2 sprays into both nostrils 3 (three) times daily. As needed for nasal congestion, runny nose 12/13/21   Theadora Rama Scales, PA-C  lisinopril (PRINIVIL,ZESTRIL) 2.5 MG tablet Take 1 tablet (2.5 mg total) by mouth daily. 11/04/16   Milagros Loll, MD  metoprolol succinate (TOPROL-XL) 25 MG 24 hr tablet Take 25 mg by mouth daily.  11/09/16 05/13/18  [provider]  nitroGLYCERIN (NITROSTAT) 0.4 MG SL tablet Place 0.4 mg under the tongue every 5 (five) minutes as needed for chest pain.    [provider]  rosuvastatin (CRESTOR) 10 MG tablet Take 10 mg by mouth at bedtime. 11/29/20   [provider]      Allergies    Patient has no known allergies.    Review of Systems   Review of Systems  All other systems reviewed and are negative.   Physical Exam Updated Vital Signs BP (!)  117/98 (BP Location: Left Arm)   Pulse 91   Temp 98.6 F (37 C) (Oral)   Resp 18   Ht 6\' 1"  (1.854 m)   Wt 120.2 kg   SpO2 98%   BMI 34.96 kg/m  Physical Exam Vitals and nursing note reviewed.  Constitutional:      General: He is not in acute distress.    Appearance: Normal appearance.  HENT:     Mouth/Throat:     Comments: Uvula midline without swelling. No posterior pharyngeal erythema or tonsillar exudate noted. Patent airway. Pt able to speak in clear complete sentences. Tolerating oral secretions. Eyes:     General: No scleral icterus.    Extraocular Movements: Extraocular movements intact.  Cardiovascular:     Rate and Rhythm: Normal rate.  Pulmonary:     Effort: Pulmonary effort is normal. No respiratory distress.  Abdominal:     Palpations: Abdomen is soft. There is no mass.     Tenderness: There is no abdominal tenderness.  Musculoskeletal:        General: Normal range of motion.     Cervical back: Neck supple.  Skin:    General: Skin is warm and dry.     Findings: No rash.  Neurological:     Mental Status: He is alert.     Sensory: Sensation is intact.     Motor: Motor function is intact.  Psychiatric:  Behavior: Behavior normal.     ED Results / Procedures / Treatments   Labs (all labs ordered are listed, but only abnormal results are displayed) Labs Reviewed  RESP PANEL BY RT-PCR (RSV, FLU A&B, COVID)  [id number] - Abnormal; Notable for the following components:      Result Value   SARS Coronavirus 2 by RT PCR POSITIVE (*)    All other components within normal limits  GROUP A STREP BY PCR    EKG None  Radiology No results found.  Procedures Procedures    Medications Ordered in ED Medications - No data to display  ED Course/ Medical Decision Making/ A&P Clinical Course as of 01/20/23 1248  Sun Jan 20, 2023  1238 SARS Coronavirus 2 by RT PCR(!): POSITIVE Discussed with patient and wife at bedside regarding swab findings. Offered CXR  at this time. Pt declines CXR. Offered tylenol or ibuprofen in the ED, patient declines at this time. Discussed with patient discharge treatment plan. Answered all available questions. Pt appears safe for discharge at this time.  [SB]    Clinical Course User Index [SB] [name], PA-C                                 Medical Decision Making Amount and/or Complexity of Data Reviewed Labs:  Decision-making details documented in ED Course.   Pt presents with URI like symptoms onset 4 days. Sick contacts with COVID at home. No meds tried PTA. Vital signs, pt afebrile. On exam, pt with Uvula midline without swelling. No posterior pharyngeal erythema or tonsillar exudate noted. Patent airway. Pt able to speak in clear complete sentences. Tolerating oral secretions. No acute cardiovascular, respiratory exam findings. Differential diagnosis includes COVID, Flu, strep, PNA, viral URI with cough.    Labs:  I ordered, and personally interpreted labs.  The pertinent results include:   COVID swab positive Flu swab negative RSV swab negative Strep swab negative   Disposition: Pt presentation suspicious for COVID-19. Doubt Flu, RSV, PNA, or viral URI with cough at this time. After consideration of the diagnostic results and the patients response to treatment, I feel that the patient would benefit from Discharge home. Oxygen at 98% on RA at time of discharge. Offered CXR, patient declined. Offered tylenol or ibuprofen, patient declines. Work note provided. Pt instructed to follow up with his primary care provider regarding todays ED visit. Supportive care measures and strict return precautions discussed with patient at bedside. Pt acknowledges and verbalizes understanding. Pt appears safe for discharge. Follow up as indicated in discharge paperwork.    This chart was dictated using voice recognition software, Dragon. Despite the best efforts of this provider to proofread and correct errors, errors  may still occur which can change documentation meaning.   Final Clinical Impression(s) / ED Diagnoses Final diagnoses:  COVID-19    Rx / DC Orders ED Discharge Orders     None         [name], PA-C 01/20/23 1248    William Norfolk, DO 01/20/23 1341

## 2023-01-20 NOTE — ED Triage Notes (Signed)
Nasal congestion, cough, chills, body aches and sore throat since Thursday.

## 2023-01-20 NOTE — Discharge Instructions (Signed)
It was a pleasure taking care of you!   Your strep, flu, and RSV swab was negative today.  Your COVID swab was positive today.  You may take over-the-counter cough and cold medications as needed for your symptoms.  You may take over-the-counter 500 mg Tylenol every 6 hours and alternate with 600 mg ibuprofen every 6 hours as needed for your symptoms.  If you take over-the-counter Tylenol do not take over-the-counter cough and cold medications at the same time as they have Tylenol in them as well.  Ensure to maintain fluid intake with tea, soup, broth, Pedialyte, Gatorade, water. You may follow-up with your primary care provider as needed.  Return to the Emergency Department if you are experiencing trouble breathing, chest pain, decreased fluid intake or worsening symptoms.
